# Patient Record
Sex: Female | Born: 1975
Health system: Southern US, Community
[De-identification: ages and names within clinical notes are randomized; demographics above are authoritative.]

## PROBLEM LIST (undated history)

## (undated) DIAGNOSIS — F329 Major depressive disorder, single episode, unspecified: Secondary | ICD-10-CM

## (undated) DIAGNOSIS — E7800 Pure hypercholesterolemia, unspecified: Secondary | ICD-10-CM

## (undated) DIAGNOSIS — F32A Depression, unspecified: Secondary | ICD-10-CM

## (undated) DIAGNOSIS — B958 Unspecified staphylococcus as the cause of diseases classified elsewhere: Secondary | ICD-10-CM

## (undated) HISTORY — PX: ABDOMINAL ADHESION SURGERY: SHX90

## (undated) HISTORY — PX: DILATION AND CURETTAGE OF UTERUS: SHX78

---

## 2004-02-25 ENCOUNTER — Other Ambulatory Visit: Admission: RE | Admit: 2004-02-25 | Discharge: 2004-02-25 | Payer: Self-pay | Admitting: Family Medicine

## 2005-03-04 ENCOUNTER — Other Ambulatory Visit: Admission: RE | Admit: 2005-03-04 | Discharge: 2005-03-04 | Payer: Self-pay | Admitting: Family Medicine

## 2006-03-11 ENCOUNTER — Other Ambulatory Visit: Admission: RE | Admit: 2006-03-11 | Discharge: 2006-03-11 | Payer: Self-pay | Admitting: Family Medicine

## 2006-12-24 ENCOUNTER — Inpatient Hospital Stay (HOSPITAL_COMMUNITY): Admission: AD | Admit: 2006-12-24 | Discharge: 2006-12-27 | Payer: Self-pay | Admitting: Obstetrics and Gynecology

## 2007-01-14 ENCOUNTER — Encounter: Admission: RE | Admit: 2007-01-14 | Discharge: 2007-02-13 | Payer: Self-pay | Admitting: Obstetrics and Gynecology

## 2007-02-14 ENCOUNTER — Encounter: Admission: RE | Admit: 2007-02-14 | Discharge: 2007-03-15 | Payer: Self-pay | Admitting: Obstetrics and Gynecology

## 2007-03-16 ENCOUNTER — Encounter: Admission: RE | Admit: 2007-03-16 | Discharge: 2007-04-15 | Payer: Self-pay | Admitting: Obstetrics and Gynecology

## 2007-04-07 ENCOUNTER — Other Ambulatory Visit: Admission: RE | Admit: 2007-04-07 | Discharge: 2007-04-07 | Payer: Self-pay | Admitting: Obstetrics and Gynecology

## 2007-04-16 ENCOUNTER — Encounter: Admission: RE | Admit: 2007-04-16 | Discharge: 2007-05-15 | Payer: Self-pay | Admitting: Obstetrics and Gynecology

## 2007-05-16 ENCOUNTER — Encounter: Admission: RE | Admit: 2007-05-16 | Discharge: 2007-06-15 | Payer: Self-pay | Admitting: Obstetrics and Gynecology

## 2007-06-16 ENCOUNTER — Encounter: Admission: RE | Admit: 2007-06-16 | Discharge: 2007-06-29 | Payer: Self-pay | Admitting: Obstetrics and Gynecology

## 2009-11-25 ENCOUNTER — Inpatient Hospital Stay (HOSPITAL_COMMUNITY): Admission: AD | Admit: 2009-11-25 | Discharge: 2009-11-25 | Payer: Self-pay | Admitting: Obstetrics and Gynecology

## 2009-12-04 ENCOUNTER — Ambulatory Visit (HOSPITAL_COMMUNITY): Admission: RE | Admit: 2009-12-04 | Discharge: 2009-12-04 | Payer: Self-pay | Admitting: Obstetrics and Gynecology

## 2009-12-10 ENCOUNTER — Ambulatory Visit (HOSPITAL_COMMUNITY): Admission: RE | Admit: 2009-12-10 | Discharge: 2009-12-10 | Payer: Self-pay | Admitting: Obstetrics and Gynecology

## 2009-12-31 DEATH — deceased

## 2010-11-20 ENCOUNTER — Ambulatory Visit (HOSPITAL_COMMUNITY)
Admission: RE | Admit: 2010-11-20 | Discharge: 2010-11-20 | Payer: Self-pay | Source: Home / Self Care | Attending: Obstetrics and Gynecology | Admitting: Obstetrics and Gynecology

## 2010-11-24 LAB — CBC
HCT: 39.5 % (ref 36.0–46.0)
Hemoglobin: 13.5 g/dL (ref 12.0–15.0)
MCH: 28.6 pg (ref 26.0–34.0)
MCHC: 34.2 g/dL (ref 30.0–36.0)
MCV: 83.7 fL (ref 78.0–100.0)
Platelets: 275 10*3/uL (ref 150–400)
RBC: 4.72 MIL/uL (ref 3.87–5.11)
RDW: 12.1 % (ref 11.5–15.5)
WBC: 7.3 10*3/uL (ref 4.0–10.5)

## 2010-12-03 DEATH — deceased

## 2010-12-10 NOTE — Op Note (Signed)
  NAME:  CLARITY, CISZEK NO.:  [id number]  MEDICAL RECORD NO.:  [id number]          PATIENT TYPE:  AMB  LOCATION:  SDC                           [facility]:  [facility]  PHYSICIAN:  Gerald Leitz, MD          DATE OF BIRTH:  01-24-1976  DATE OF PROCEDURE:  11/20/2010 DATE OF DISCHARGE:  11/20/2010                              OPERATIVE REPORT   PREOPERATIVE DIAGNOSIS:  Missed abortion at 8 weeks 5 days.  POSTOPERATIVE DIAGNOSIS:  Missed abortion at 8 weeks 5 days.  PROCEDURE:  Dilation and suction curettage.  SURGEON:  Dr. Gerald Leitz.  ASSISTANT:  None.  ANESTHESIA:  MAC.  FINDINGS:  8 cm uterus.  SPECIMEN:  Products of conception to Pathology.  ESTIMATED BLOOD LOSS:  Minimal.  COMPLICATIONS:  None.  PROCEDURE:  The patient was taken to the operating room where she was placed under MAC anesthesia.  She was placed in the dorsal lithotomy position, prepped and draped in a sterile fashion.  Speculum was placed into the vaginal vault.  Anterior lip of the cervix was grasped with single-tooth tenaculum.  10 mL of 0.25% Marcaine were injected at the 4 and 8 o'clock positions.  The uterus was then sounded to 8 cm.  It was then dilated to 8 mm.  The 8-mm suction curette was introduced, and the products of conception were removed under suction of 60 cm mercury following suction curettage.  Sharp curette was used and a gritty texture was noted.  The suction curette was then reintroduced and remaining products were removed.  Single-tooth tenaculum was removed. The patient had some bleeding from the left aspect of the tenaculum site.  Silver nitrate was applied and 0.2 mg of Methergine were given IM.  The patient was taken to the recovery room awake and in stable condition.    Gerald Leitz, MD    TC/MEDQ  D:  11/20/2010  T:  12/14/10  Job:  [id number]  Electronically Signed by Gerald Leitz MD on 12/10/2010 08:48:16 AM

## 2011-01-18 LAB — URINALYSIS, ROUTINE W REFLEX MICROSCOPIC
Bilirubin Urine: NEGATIVE
Glucose, UA: NEGATIVE mg/dL
Hgb urine dipstick: NEGATIVE
Ketones, ur: NEGATIVE mg/dL
Nitrite: NEGATIVE
Protein, ur: NEGATIVE mg/dL
Specific Gravity, Urine: <1.005 — ABNORMAL LOW (ref 1.005–1.030)
Urobilinogen, UA: 0.2 mg/dL (ref 0.0–1.0)
pH: 6 (ref 5.0–8.0)

## 2011-01-18 LAB — CBC
HCT: 40.1 % (ref 36.0–46.0)
Hemoglobin: 13.6 g/dL (ref 12.0–15.0)
MCHC: 33.8 g/dL (ref 30.0–36.0)
MCV: 88.4 fL (ref 78.0–100.0)
Platelets: 290 10*3/uL (ref 150–400)
RBC: 4.53 MIL/uL (ref 3.87–5.11)
RDW: 12.6 % (ref 11.5–15.5)
WBC: 6 10*3/uL (ref 4.0–10.5)

## 2011-01-18 LAB — POCT PREGNANCY, URINE: Preg Test, Ur: POSITIVE

## 2011-01-18 LAB — WET PREP, GENITAL
Clue Cells Wet Prep HPF POC: NONE SEEN
Trich, Wet Prep: NONE SEEN
Yeast Wet Prep HPF POC: NONE SEEN

## 2011-01-18 LAB — GC/CHLAMYDIA PROBE AMP, GENITAL
Chlamydia, DNA Probe: NEGATIVE
GC Probe Amp, Genital: NEGATIVE

## 2011-01-18 LAB — HCG, QUANTITATIVE, PREGNANCY: hCG, Beta Chain, Quant, S: 3352 m[IU]/mL — ABNORMAL HIGH (ref <5)

## 2011-01-18 LAB — ABO/RH: ABO/RH(D): A POS

## 2011-01-21 LAB — CBC
HCT: 41.5 % (ref 36.0–46.0)
Hemoglobin: 13.9 g/dL (ref 12.0–15.0)
MCHC: 33.5 g/dL (ref 30.0–36.0)
MCV: 88.5 fL (ref 78.0–100.0)
Platelets: 283 10*3/uL (ref 150–400)
RBC: 4.69 MIL/uL (ref 3.87–5.11)
RDW: 12.6 % (ref 11.5–15.5)
WBC: 6.6 10*3/uL (ref 4.0–10.5)

## 2011-01-27 ENCOUNTER — Other Ambulatory Visit: Payer: Self-pay | Admitting: Obstetrics and Gynecology

## 2011-01-27 ENCOUNTER — Other Ambulatory Visit (HOSPITAL_COMMUNITY)
Admission: RE | Admit: 2011-01-27 | Discharge: 2011-01-27 | Disposition: A | Discharge status: Home / Self Care | Payer: Managed Care, Other (non HMO) | Source: Ambulatory Visit | Attending: Obstetrics and Gynecology | Admitting: Obstetrics and Gynecology

## 2011-01-27 DIAGNOSIS — Z01419 Encounter for gynecological examination (general) (routine) without abnormal findings: Secondary | ICD-10-CM | POA: Insufficient documentation

## 2011-03-18 ENCOUNTER — Ambulatory Visit (HOSPITAL_COMMUNITY)
Admission: RE | Admit: 2011-03-18 | Discharge: 2011-03-18 | Disposition: A | Discharge status: Home / Self Care | Payer: Managed Care, Other (non HMO) | Source: Ambulatory Visit | Attending: Obstetrics and Gynecology | Admitting: Obstetrics and Gynecology

## 2011-03-18 ENCOUNTER — Other Ambulatory Visit: Payer: Self-pay | Admitting: Obstetrics and Gynecology

## 2011-03-18 ENCOUNTER — Ambulatory Visit (HOSPITAL_COMMUNITY): Payer: Managed Care, Other (non HMO)

## 2011-03-18 DIAGNOSIS — N971 Female infertility of tubal origin: Secondary | ICD-10-CM

## 2011-03-18 DIAGNOSIS — N856 Intrauterine synechiae: Secondary | ICD-10-CM | POA: Insufficient documentation

## 2011-03-18 LAB — CBC
HCT: 42.1 % (ref 36.0–46.0)
Hemoglobin: 14 g/dL (ref 12.0–15.0)
MCH: 28.7 pg (ref 26.0–34.0)
MCHC: 33.3 g/dL (ref 30.0–36.0)
MCV: 86.4 fL (ref 78.0–100.0)
Platelets: 258 10*3/uL (ref 150–400)
RBC: 4.87 MIL/uL (ref 3.87–5.11)
RDW: 12.3 % (ref 11.5–15.5)
WBC: 6.3 10*3/uL (ref 4.0–10.5)

## 2011-03-18 LAB — HCG, SERUM, QUALITATIVE: Preg, Serum: NEGATIVE

## 2011-03-20 NOTE — Discharge Summary (Signed)
NAME:  Margaret Kim, Margaret Kim NO.:  [id number]   MEDICAL RECORD NO.:  [id number]          PATIENT TYPE:  INP   LOCATION:  9142                          [facility]  [facility]   PHYSICIAN:  Gerald Leitz, MD          DATE OF BIRTH:  1976-05-09   DATE OF ADMISSION:  12/24/2006  DATE OF DISCHARGE:  12/27/2006                               DISCHARGE SUMMARY   ADMISSION DIAGNOSES:  Term intrauterine pregnancy.  1. Breech presentation.  2. Labor.   DISCHARGE DIAGNOSES:  Term intrauterine pregnancy.  1. Breech presentation.  2. Labor.   BRIEF HOSPITAL COURSE:  The patient was admitted on December 24, 2006,  in labor.  She was found to have a breech presentation and underwent  cesarean section.  She delivered a female infant with Apgars of 8 and 9  at one and five minutes respectively.  Infant's weight was 3440 g.  Length was 18-3/4 inches.  The patient did well postoperatively.  She  did develop anemia with a hemoglobin of 9.2 on December 25, 2006, which  is postop day #1.  She received routine postoperative care and was  started on iron sulfate, was discharged on December 27, 2006.   MEDICATIONS AT DISCHARGE:  Iron sulfate, Motrin and Percocet.   Follow up with Dr. Richardson Dopp in 102 days for staple removal and in 4-6 weeks  for postpartum visit.   CONDITION ON DISCHARGE:  Improved.   DIET:  Regular.   ACTIVITY:  Pelvic rest x6 weeks.  No driving for 2 weeks.  No heavy  lifting.      Gerald Leitz, MD  Electronically Signed     TC/MEDQ  D:  01/12/2007  T:  01/13/2007  Job:  [id number]

## 2011-03-20 NOTE — Op Note (Signed)
NAME:  Margaret Kim, Margaret Kim NO.:  [id number]   MEDICAL RECORD NO.:  [id number]          PATIENT TYPE:  INP   LOCATION:  9142                          [facility]   PHYSICIAN:  Gerald Leitz, MD          DATE OF BIRTH:  1975-12-28   DATE OF PROCEDURE:  12/24/2006  DATE OF DISCHARGE:                               OPERATIVE REPORT   PREOPERATIVE DIAGNOSES:  1. Term intrauterine pregnancy.  2. Breech presentation.  3. Thick meconium.   POSTOPERATIVE DIAGNOSES:  1. Term intrauterine pregnancy.  2. Breech presentation.  3. Thick meconium.   PROCEDURE:  Low transverse cesarean section   SURGEON:  Gerald Leitz, MD   ASSISTANT:  Charles A. Delcambre, MD.   Bronwen Betters BLOOD LOSS:  1200 mL.   FLUIDS:  Per anesthesia.   URINE OUTPUT:  Per anesthesia.   FINDINGS:  Female infant in the breech presentation, with Apgars of 8  and 9 at 1 and 5 minutes respectively.  Cord pH:  Arterial cord pH of  7.19.  Weight 7 pounds 9 ounces.  Ovaries and fallopian tubes appeared  normal bilaterally.   INDICATIONS:  This is a 35 year old G1 who presented to labor and  delivery completely dilated, breech presentation and thick meconium.  Underwent a primary cesarean section due to malpresentation.   DESCRIPTION OF PROCEDURE:  Informed consent was obtained.  The patient  was taken to the operating room, where a spinal anesthesia was placed.  It was found to be adequate.  She was then prepped and draped in the  usual sterile fashion.  A Pfannenstiel skin incision was made with the  scalpel and carried down to the underlying layer of fascia.  The fascia  was incised in the midline.  The incision was extended laterally with  Mayo scissors.  The inferior aspect of the fascial incision was grasped  with Kocher clamps, elevated the underlying rectus muscles which were  dissected off with Mayo scissors. This was repeated on the inferior  aspect of the fascial incision.  The rectus muscles were  separated in  the midline.  The peritoneum was identified and entered bluntly, and  Alexis retractor was placed.  Beneath the peritoneum the vesicouterine  peritoneum was then grasped with pickups and entered sharply with  Metzenbaum scissors.  The incision was extended laterally.  The bladder  flap was created digitally.  Bladder blade was inserted.  A low  transverse uterine incision was made with the scalpel.  The infant's  buttocks were removed.  Breech maneuvers were performed and the infant  was delivered.  Mouth and nose were suctioned with the DeLee.  The cord  was clamped x2 and the infant was handed off to the awaiting  neonatologist.  The placenta was removed manually.  The uterus was  exteriorized and cleared of all clot and debris.  The uterine incision  was repaired with 0 Vicryl in a running locked fashion.  A second layer  of the same suture was used for hemostasis.  The patient was noted to  have an extension of  the uterine incision along the right and lateral  aspect of the cervix.  This was repaired with 2-0 Vicryl in a running  locked fashion.  Excellent hemostasis was noted.  The uterus was  returned to the abdomen.  The abdomen was copiously irrigated and  cleared of all clot and debris.  The Alexis retractor was removed and  the fascia was reapproximated with 0 PDS.  The skin was closed with  staples.  Sponge, lap and needle counts were correct x2.  Ancef 2 grams  were given at cord clamp.   SPECIMEN:  Placenta.   DISPOSITION:  Specimen sent to labor and delivery.      Gerald Leitz, MD  Electronically Signed     TC/MEDQ  D:  12/24/2006  T:  12/24/2006  Job:  [id number]

## 2011-04-17 ENCOUNTER — Ambulatory Visit (HOSPITAL_COMMUNITY)
Admission: RE | Admit: 2011-04-17 | Discharge: 2011-04-17 | Disposition: A | Discharge status: Home / Self Care | Payer: Managed Care, Other (non HMO) | Source: Ambulatory Visit | Attending: Family Medicine | Admitting: Family Medicine

## 2011-04-17 DIAGNOSIS — M79609 Pain in unspecified limb: Secondary | ICD-10-CM

## 2011-06-05 NOTE — Op Note (Signed)
NAME:  Margaret Kim, WALKO               ACCOUNT NO.:  [id number]  MEDICAL RECORD NO.:  [id number]           PATIENT TYPE:  O  LOCATION:  [facility]                          [facility]  PHYSICIAN:  Fermin Schwab, MD   DATE OF BIRTH:  07-31-1976  DATE OF PROCEDURE:  03/18/2011 DATE OF DISCHARGE:                              OPERATIVE REPORT   PREOPERATIVE DIAGNOSIS:  Rule out uterine septum versus intrauterine adhesions.  POSTOPERATIVE DIAGNOSIS:  Intrauterine adhesions and retained old products of conception.  PROCEDURES:  Hysteroscopy, intraoperative HSG, lysis of adhesions, suction and curettage.  SURGEON:  Fermin Schwab, MD  ANESTHESIA:  General anesthesia.  COMPLICATIONS:  None.  BLOOD LOSS:  Minimal.  SPECIMENS:  Uterine curettings to Pathology.  FINDINGS:  On exam under anesthesia, vagina, external genitalia, Bartholin, Skene's, and urethra were normal.  External cervical os was distorted with healed postpartum laceration.  Endocervical canal was normal.  There was old products of conception obstructing the right half of the lower uterine segment and upper uterine fundus.  We could navigate around this and see the right tubal ostium as well as the left tubal ostium.  There were adhesions between anterior and posterior uterine wall that were holding this nonvital products of conception tissue in place.  In addition, there was fundal, marginal filling adhesion appearing as if it was a partial septum.  Intraoperative HSG confirmed above findings as well as confirming bilateral tubal patency.  DESCRIPTION OF PROCEDURE:  The patient was placed in lithotomy position. She was prepped and draped in sterile manner.  Exam under anesthesia showed the above findings.  Anterior cervical lip was carefully grasped with a tenaculum and dilute vasopressin solution 0.4 units/mL was injected into the cervical stroma.  A 5-French balloon catheter was used for an HSG.  Using  Hexabrix, an intraoperative HSG was performed and above findings were noted.  Next, a 1.5% glycine distention medium was used for distention during hysteroscopy, which was performed using a Slim-Line 30-degree hysteroscope.  Above findings were again confirmed. I first lysed the adhesion holding the products of conception which was stretching between the anterior and posterior walls of the uterus.  Then the fundal notching due to adhesion was cut with hysteroscopic scissors. Then the uterus sounded to 7 cm and a size 7 curved curette on a [place] machine was used to do suction curettage.  The above-mentioned nonviable tissue was aspirated and the hysteroscope was repeated and some more lysis of the lateral adhesions were carried out.  The fundal adhesiolysis was not carried out any further since fundus was noted to be attenuated.  Procedure was terminated and hemostasis was ensured. The instrument and sponge count were correct.  As an adhesion barrier, a Seprafilm sheath was rolled up and carefully inserted up to the fundus and left as an adhesion barrier.  It was trimmed off at the external cervical os.  The patient will take 2.5 mg b.i.d. high-dose conjugated equine estrogens for the next 30 days to which 10 mg of medroxyprogesterone acetate will be added for the last 5 days.  She will also take doxycycline 100 mg b.i.d.  for the next 7 days.     Fermin Schwab, MD     TY/MEDQ  D:  03/18/2011  T:  03/19/2011  Job:  [id number]  Electronically Signed by Fermin Schwab MD on 06/05/2011 04:12:28 PM

## 2011-08-11 LAB — GC/CHLAMYDIA PROBE AMP, GENITAL
Chlamydia: NEGATIVE
Gonorrhea: NEGATIVE

## 2011-08-15 ENCOUNTER — Encounter (HOSPITAL_COMMUNITY): Payer: Self-pay | Admitting: *Deleted

## 2011-08-15 ENCOUNTER — Inpatient Hospital Stay (HOSPITAL_COMMUNITY)
Admission: AD | Admit: 2011-08-15 | Discharge: 2011-08-15 | Disposition: A | Discharge status: Home / Self Care | Payer: Managed Care, Other (non HMO) | Source: Ambulatory Visit | Attending: Obstetrics and Gynecology | Admitting: Obstetrics and Gynecology

## 2011-08-15 ENCOUNTER — Inpatient Hospital Stay (HOSPITAL_COMMUNITY): Payer: Managed Care, Other (non HMO)

## 2011-08-15 DIAGNOSIS — O4690 Antepartum hemorrhage, unspecified, unspecified trimester: Secondary | ICD-10-CM

## 2011-08-15 DIAGNOSIS — O209 Hemorrhage in early pregnancy, unspecified: Secondary | ICD-10-CM | POA: Insufficient documentation

## 2011-08-15 NOTE — Progress Notes (Signed)
G4P1 SAB x2. Scant vag bleeding since 1730. Bright red. Very mild cramping.

## 2011-08-15 NOTE — ED Provider Notes (Signed)
History     Chief Complaint  Patient presents with  . Vaginal Bleeding   HPI  Patient is here with c/o continued vaginal bleeding. She only sees the bright red blood when she wipes. She was seen in her dr's office on Tuesday and Thursday. Both days they were unable to doppler fht but saw fht via ultrasound. Unable to doppler today. She c/o mild abdominal cramping.   History reviewed. No pertinent past medical history.  Past Surgical History  Procedure Date  . Abdominal adhesion surgery     removed in may 2012  . Cesarean section   . Dilation and curettage of uterus     sab x2 8weeks    No family history on file.  History  Substance Use Topics  . Smoking status: Never Smoker   . Smokeless tobacco: Not on file  . Alcohol Use: No    Allergies:  Allergies  Allergen Reactions  . Sulfa Antibiotics     As infant    Prescriptions prior to admission  Medication Sig Dispense Refill  . acetaminophen (TYLENOL) 325 MG tablet Take 650 mg by mouth every 6 (six) hours as needed. For pain       . Prenatal Vit-Fe Fumarate-FA (MULTIVITAMIN-PRENATAL) 27-0.8 MG TABS Take 1 tablet by mouth daily.          Review of Systems  Gastrointestinal: Positive for abdominal pain (cramping).  Genitourinary:       Vaginal bleeding   Physical Exam   Blood pressure 136/80, pulse 84, temperature 98.5 F (36.9 C), temperature source Oral, resp. rate 20, height 5\' 9"  (1.753 m), weight 97.342 kg (214 lb 9.6 oz), SpO2 98.00%.  Physical Exam  Constitutional: She is oriented to person, place, and time. She appears well-developed and well-nourished.  HENT:  Head: Normocephalic.  Neck: Normal range of motion. Neck supple.  Cardiovascular: Normal rate, regular rhythm and normal heart sounds.  Exam reveals no gallop and no friction rub.   No murmur heard. Respiratory: Effort normal and breath sounds normal. No respiratory distress.  GI: Soft. She exhibits no mass. There is no tenderness. There is no  rebound and no guarding.  Genitourinary: No bleeding around the vagina.  Neurological: She is alert and oriented to person, place, and time.  Skin: Skin is warm and dry.    MAU Course  Procedures  Ultrasound -  Intrauterine gestational sac: Visualized/normal in shape.  Yolk sac: Not visualized  Embryo: Present  Cardiac Activity: Present  Heart Rate: 149 bpm  CRL: 7.2 cm mm 13 w 3 d [place] EDC: 02/17/2012  Maternal uterus/Adnexae:  No subchorionic hemorrhage.  Placenta does not appear to be near the internal cervical os.  Ovaries were not identified on transabdominal imaging, question due  to obscuration by bowel gas.  No free pelvic fluid.  Consulted with Dr. Richardson Dopp > DC to home  Assessment and Plan  Bleeding during early pregnancy  DC to home F/U as scheduled  [facility] 08/15/2011, 9:48 PM

## 2011-08-15 NOTE — Progress Notes (Signed)
Patient is here with c/o continued vaginal bleeding. She only sees the bright red blood when she wipes. She was seen in her dr's office on Tuesday and Thursday. Both days they were unable to doppler fht but saw fht via ultrasound. Unable to doppler today. She c/o mild abdominal cramping that is not new but has right hip pain today.Marland Kitchen

## 2011-09-18 ENCOUNTER — Other Ambulatory Visit (HOSPITAL_COMMUNITY): Payer: Self-pay | Admitting: Obstetrics and Gynecology

## 2011-09-18 DIAGNOSIS — Z3689 Encounter for other specified antenatal screening: Secondary | ICD-10-CM

## 2011-09-18 DIAGNOSIS — R772 Abnormality of alphafetoprotein: Secondary | ICD-10-CM

## 2011-09-22 ENCOUNTER — Ambulatory Visit (HOSPITAL_COMMUNITY)
Admission: RE | Admit: 2011-09-22 | Discharge: 2011-09-22 | Disposition: A | Discharge status: Home / Self Care | Payer: Managed Care, Other (non HMO) | Source: Ambulatory Visit | Attending: Obstetrics and Gynecology | Admitting: Obstetrics and Gynecology

## 2011-09-22 ENCOUNTER — Encounter (HOSPITAL_COMMUNITY): Payer: Self-pay

## 2011-09-22 DIAGNOSIS — O09529 Supervision of elderly multigravida, unspecified trimester: Secondary | ICD-10-CM | POA: Insufficient documentation

## 2011-09-22 DIAGNOSIS — R772 Abnormality of alphafetoprotein: Secondary | ICD-10-CM

## 2011-09-22 DIAGNOSIS — Z1389 Encounter for screening for other disorder: Secondary | ICD-10-CM | POA: Insufficient documentation

## 2011-09-22 DIAGNOSIS — Z3689 Encounter for other specified antenatal screening: Secondary | ICD-10-CM

## 2011-09-22 DIAGNOSIS — Z363 Encounter for antenatal screening for malformations: Secondary | ICD-10-CM | POA: Insufficient documentation

## 2011-09-22 DIAGNOSIS — O34219 Maternal care for unspecified type scar from previous cesarean delivery: Secondary | ICD-10-CM | POA: Insufficient documentation

## 2011-09-22 DIAGNOSIS — O358XX0 Maternal care for other (suspected) fetal abnormality and damage, not applicable or unspecified: Secondary | ICD-10-CM | POA: Insufficient documentation

## 2011-09-22 NOTE — Progress Notes (Signed)
Genetic Counseling  High-Risk Gestation Note  Appointment Date:  09/22/2011 Referred By: Jessee Avers., MD Date of Birth:  Feb 10, 1976 Partner:  Margaret Kim    Pregnancy History: [id number] Estimated Date of Delivery: 02/20/12 Estimated Gestational Age: [redacted]w[redacted]d Attending: Particia Nearing, MD   [name] and her husband, Mr. Margaret Kim, were seen for genetic counseling regarding a maternal age of 35 y.o. and an increased risk for Down syndrome based on Quad screening performed through [facility].  They were counseled regarding maternal age and the association with risk for chromosome conditions due to nondisjunction with aging of the ova.   We reviewed chromosomes, nondisjunction, and the associated 1 in 141 risk for fetal aneuploidy related to a maternal age of 76 at [redacted] weeks gestation.  They were counseled that the risk for aneuploidy decreases as gestational age increases, accounting for those pregnancies which spontaneously abort.  We specifically discussed Down syndrome (trisomy 36), trisomies 37 and 49, and sex chromosome aneuploidies (47,XXX and 47,XXY) including the common features and prognoses of each.   We also reviewed [name]'s maternal serum Quad screen result and the associated increase in risk for fetal Down syndrome (1 in 250 to 1 in 123).  They were counseled regarding other explanations for a screen positive result including normal variation and differences in maternal metabolism.  In addition, we reviewed the screen negative risks for trisomy 18 (1 in 74) and ONTDs (1 in 62).  They understand that Quad screening provides a pregnancy specific risk for Down syndrome, but is not considered to be diagnostic.  They were counseled regarding other available screening and diagnostic options including ultrasound and amniocentesis.  The risks, benefits, and limitations of each of these options were reviewed in detail.  After thoughtful consideration of these options, they elected  to proceed with ultrasound, but declined amniocentesis at this time. The couple planned to further consider the option of amniocentesis and contact our office should she elect to pursue this testing in the pregnancy.  A complete detailed ultrasound was performed today.  The ultrasound report will be sent under separate cover.  They understand that screening tests cannot rule out all birth defects or genetic syndromes.  The patient was advised of this limitation and states she still does not want diagnostic testing at this time.  However, they were counseled that 50-80% of fetuses with Down syndrome and up to 90% of fetuses with trisomies 13 and 18, when well visualized, have detectable anomalies or soft markers by ultrasound.   [name] was provided with written information regarding cystic fibrosis (CF) including the carrier frequency and incidence in the Caucasian population, the availability of carrier testing and prenatal diagnosis if indicated.  In addition, we discussed that CF is routinely screened for as part of the [place] newborn screening panel.  She declined testing today.   Both family histories were reviewed and found to be contributory for learning disabilities for the patient's sister. She is currently 23 years old and lives with her parents. She is attending community college. An underlying cause is not known for her learning disabilities. We discussed that there are many different causes of mental retardation and learning disabilities such as genetic differences, sporadic causes, or injuries.  A specific diagnosis for mental retardation can be determined in approximately 50% of cases.  In the remaining 50% of cases, a diagnosis may not ever be determined.  Without more specific information, potential genetic risks cannot be determined. The patient  was advised to call if she is able to find out more information regarding a specific diagnosis.      Additionally, [name] reported a  female maternal first cousin born with a hole in her heart, which required several corrective surgeries. This individual also reportedly had severe developmental delay and was able to walk with assistance. She passed away at age 27 years. An underlying cause was not known for her features. We discussed that congenital heart defects and developmental delay can each occur separately due to genetics, environment, or a combination of genes and environment. It is also possible that these features were due to a single underlying cause. We discussed that without an identified cause, we would not be able to accurately assess recurrence risk for relatives. The patient also reported a female maternal first cousin once removed with a brain abnormality that was identified prenatally. He is currently 35 years old and is able to walk but reportedly does not talk much. We discussed that additional information is needed regarding the specific brain abnormality to assess recurrence risk and possible screening or testing options available. The patient may contact us should additional information be obtained regarding either or these relatives.   The patient also reported a paternal aunt with a history of a stillbirth and multiple miscarriages prior to having her son. She reportedly had a history of endometriosis and was described as being "older" when she was pregnant. There can be many different causes for pregnancy losses including clotting factors, autoimmune conditions, antibodies, structural differences in the uterus, maternal health conditions, or chromosome rearrangements.  An underlying cause may or may not have implications for other family members.  Without further information regarding the provided family history, an accurate genetic risk cannot be calculated. Further genetic counseling is warranted if more information is obtained.  [name] also reported a female paternal first cousin once removed with sickle cell  trait. We discussed sickle cell anemia (SCA) including: the features of SCA, autosomal recessive inheritance, and the availability of carrier testing through hemoglobin electrophoresis with quantitative A2.  We reviewed that sickle cell trait is less common in the Caucasian population compared to other ethnic groups. We also discussed that testing for SCA can be done prenatally if needed, and it is also routinely screened for on the newborn screening panel.  Given this reported family history, [name] has an approximate 1 in 48 risk to carry sickle cell trait. The risk for sickle cell disease in the pregnancy would depend upon the carrier status of both parents. [name] declined sickle cell screening through hemoglobin electrophoresis at this time.   [name] denied exposure to environmental toxins or chemical agents. She denied the use of alcohol, tobacco or street drugs. She denied significant viral illnesses during the course of her pregnancy. Her medical and surgical histories were noncontributory.     I counseled this couple regarding the above risks and available options.  The approximate face-to-face time with the genetic counselor was 45 minutes.    Margaret Braun Kordelia Severin, MS, [facility] 109-Oct-2012

## 2011-09-29 ENCOUNTER — Other Ambulatory Visit (HOSPITAL_COMMUNITY): Payer: Managed Care, Other (non HMO)

## 2011-11-03 NOTE — L&D Delivery Note (Signed)
Operative Delivery Note At 1:59 PM a viable female was delivered via VBAC, Vacuum Assisted.  Presentation: vertex; Position: Occiput,, Anterior; Station: +3.  Verbal consent: obtained from patient.  Risks and benefits discussed in detail.  Risks include, but are not limited to the risks of anesthesia, bleeding, infection, damage to maternal tissues, fetal cephalhematoma.  There is also the risk of inability to effect vaginal delivery of the head, or shoulder dystocia that cannot be resolved by established maneuvers, leading to the need for emergency cesarean section.  APGAR: 8, 9; weight .   Placenta status: Intact, Spontaneous.   Cord: 3 vessels with the following complications: None.  Cord pH: NA  Anesthesia: Local lidocaine Instruments: Vacuum  Episiotomy: None Lacerations: 2nd degree;Labial;Periurethral Suture Repair: 3.0 vicryl Est. Blood Loss (mL): 400  Mom to postpartum.  Baby to nursery-stable.  Arjun Hard J. 02/12/2012, 3:05 PM

## 2011-12-01 LAB — ABO/RH: RH Type: POSITIVE

## 2011-12-01 LAB — RUBELLA ANTIBODY, IGM: Rubella: IMMUNE

## 2011-12-01 LAB — RPR: RPR: NONREACTIVE

## 2011-12-01 LAB — HIV ANTIBODY (ROUTINE TESTING W REFLEX): HIV: NONREACTIVE

## 2011-12-01 LAB — HEPATITIS B SURFACE ANTIGEN: Hepatitis B Surface Ag: NEGATIVE

## 2011-12-01 LAB — ANTIBODY SCREEN: Antibody Screen: NEGATIVE

## 2012-01-22 ENCOUNTER — Encounter (HOSPITAL_COMMUNITY): Payer: Self-pay | Admitting: Pharmacist

## 2012-01-23 IMAGING — US US OB COMP LESS 14 WK
1 series · 14 of 28 positions shown · non-contrast
Comparison: none

OBSTETRICAL ULTRASOUND:
 This ultrasound exam was performed in the [HOSPITAL] Ultrasound Department.  The OB US report was generated in the AS system, and faxed to the ordering physician.  This report is also available in [HOSPITAL]?s [employer] and in [REDACTED] PACS.

[Series 1: us pelvis complete modify · 0.26mm/px · 14 of 50 slices shown]
[im 2/50]
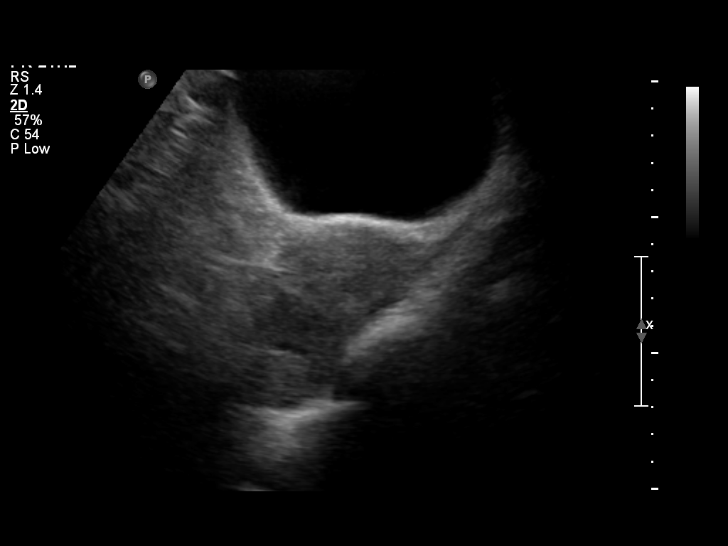
[im 6/50]
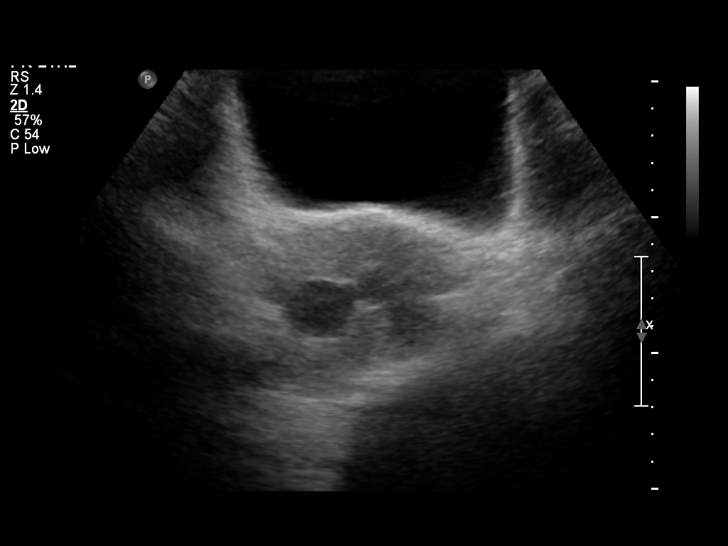
[im 10/50]
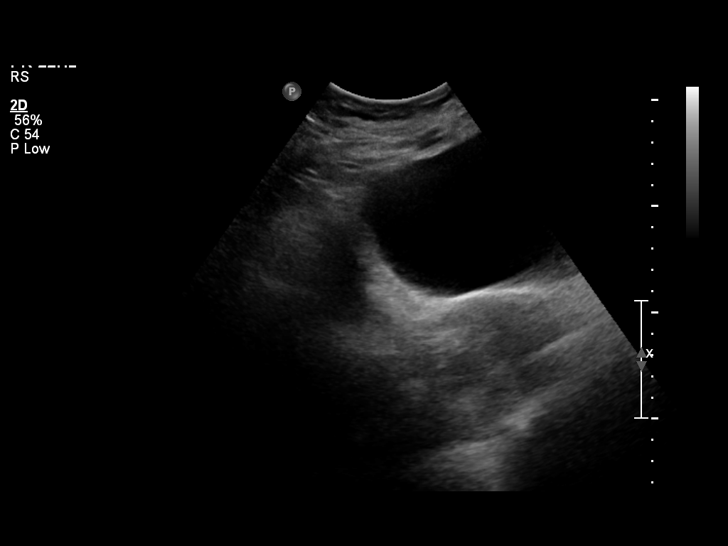
[im 13/50]
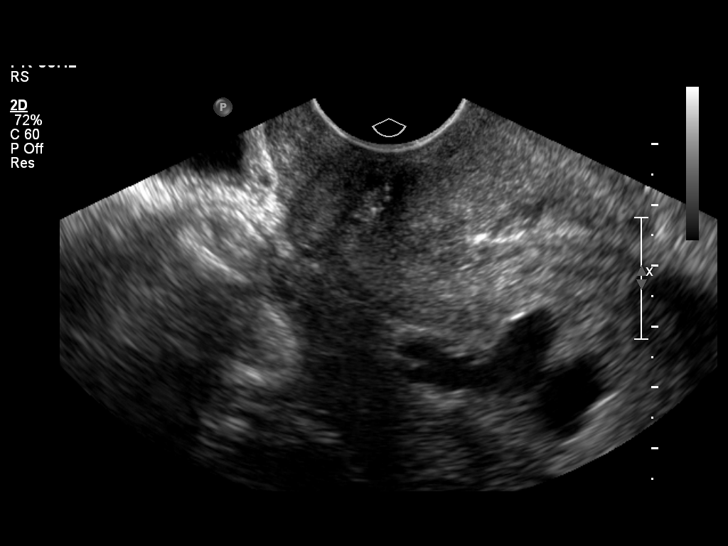
[im 17/50]
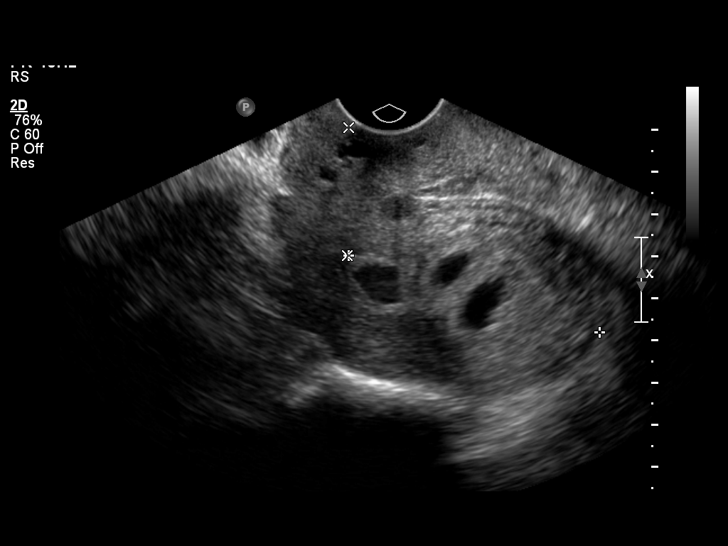
[im 20/50]
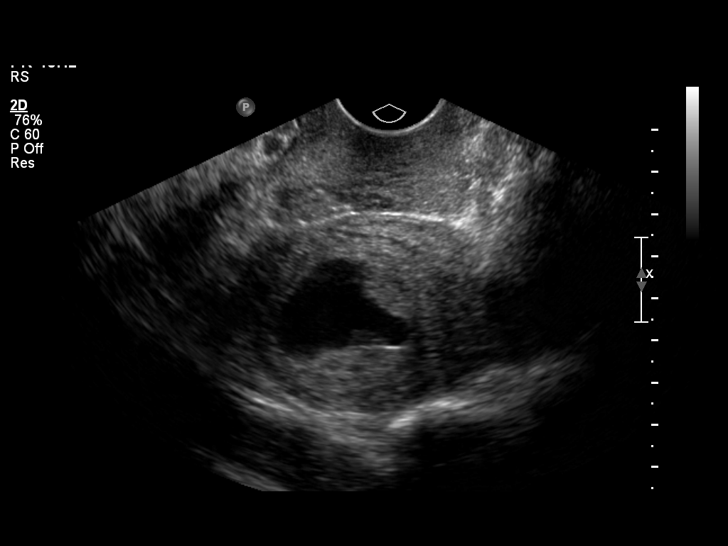
[im 24/50]
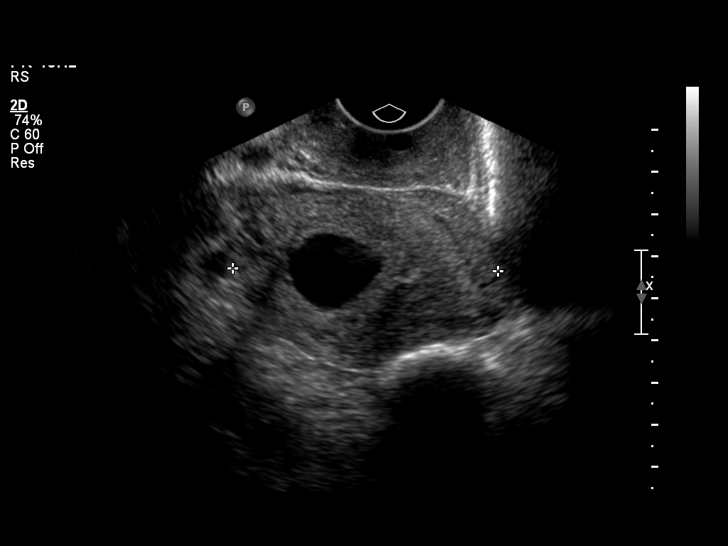
[im 28/50]
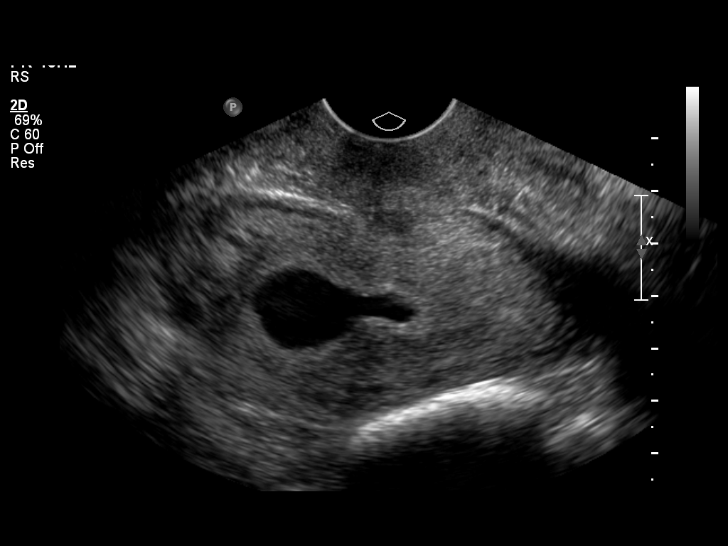
[im 31/50]
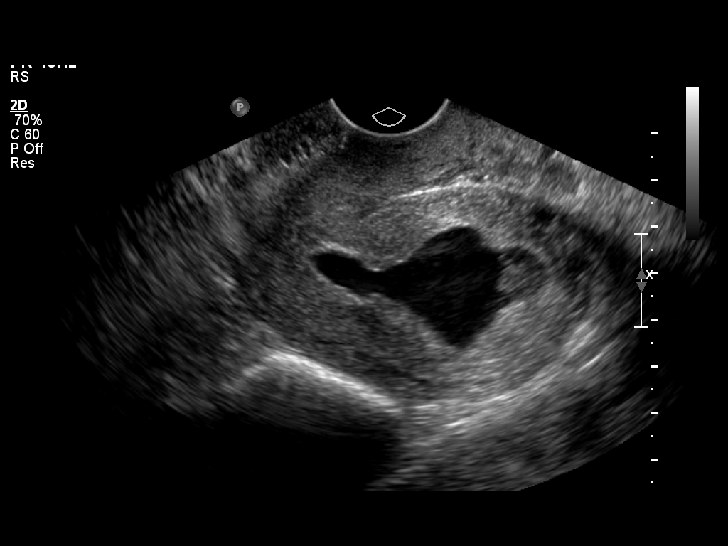
[im 35/50]
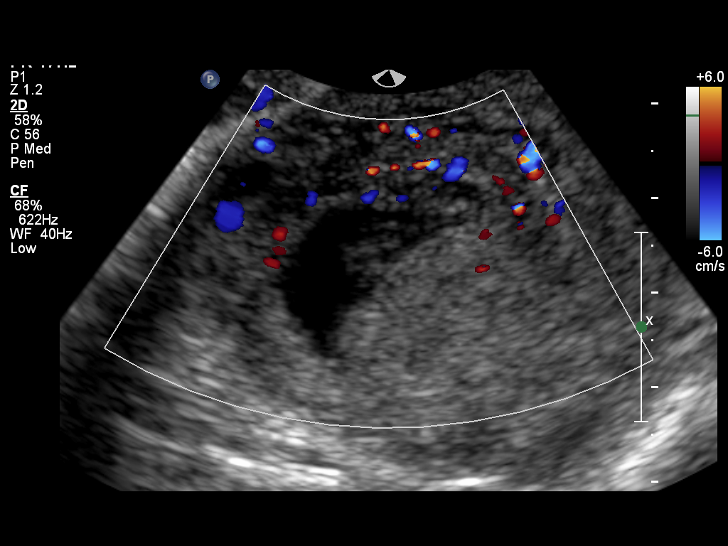
[im 39/50]
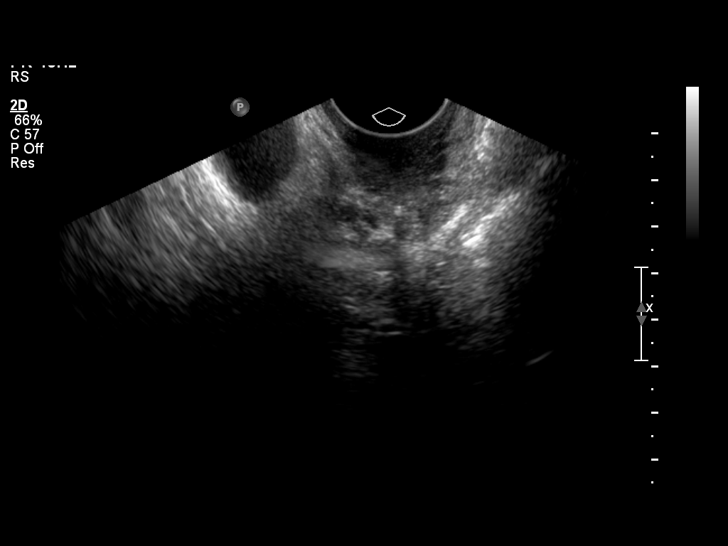
[im 42/50]
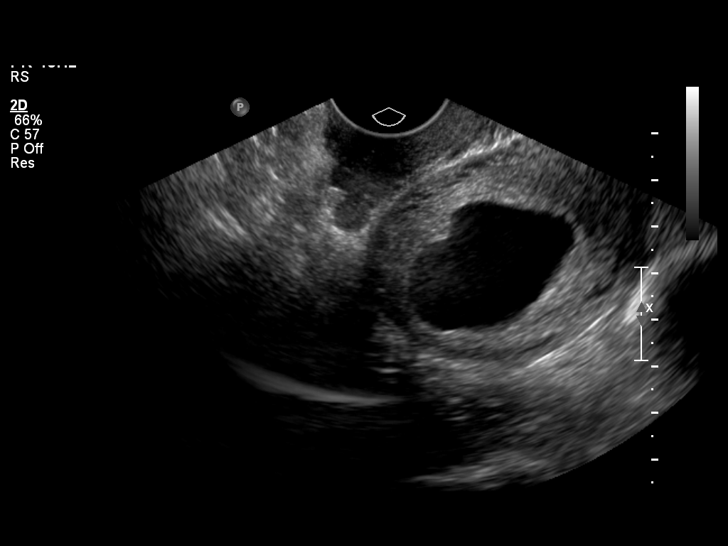
[im 46/50]
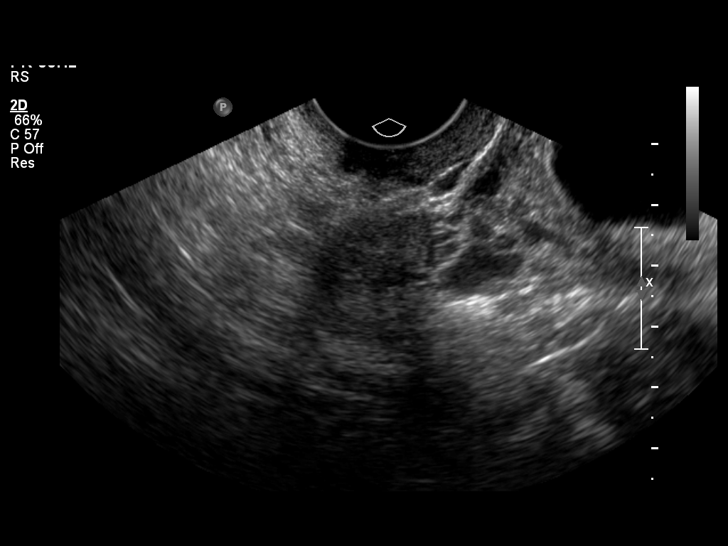
[im 50/50]
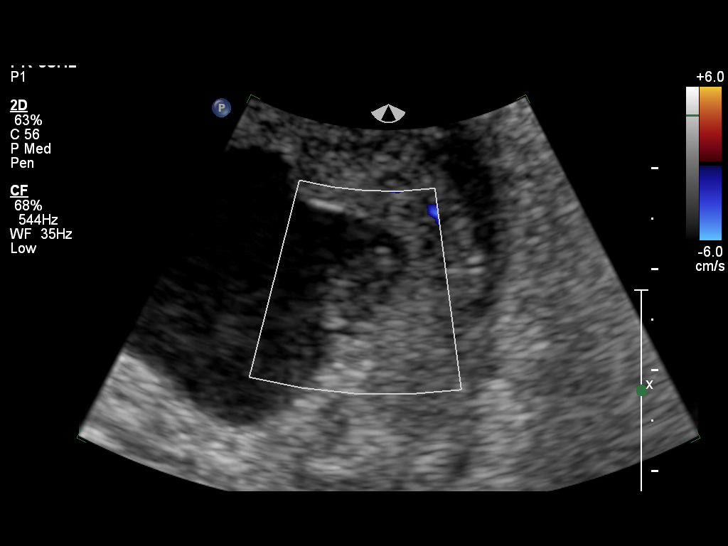

[14 of 28 positions shown; findings below may reference images not displayed]

IMPRESSION: See AS Obstetric US report.

## 2012-01-25 LAB — STREP B DNA PROBE: GBS: NEGATIVE

## 2012-02-09 ENCOUNTER — Encounter (HOSPITAL_COMMUNITY): Payer: Self-pay

## 2012-02-09 ENCOUNTER — Encounter (HOSPITAL_COMMUNITY)
Admission: RE | Admit: 2012-02-09 | Discharge: 2012-02-09 | Disposition: A | Discharge status: Home / Self Care | Payer: Managed Care, Other (non HMO) | Source: Ambulatory Visit | Attending: Obstetrics and Gynecology | Admitting: Obstetrics and Gynecology

## 2012-02-09 LAB — CBC
HCT: 39.3 % (ref 36.0–46.0)
Hemoglobin: 13.1 g/dL (ref 12.0–15.0)
MCH: 29.6 pg (ref 26.0–34.0)
MCHC: 33.3 g/dL (ref 30.0–36.0)
MCV: 88.7 fL (ref 78.0–100.0)
Platelets: 217 10*3/uL (ref 150–400)
RBC: 4.43 MIL/uL (ref 3.87–5.11)
RDW: 13.7 % (ref 11.5–15.5)
WBC: 9.5 10*3/uL (ref 4.0–10.5)

## 2012-02-09 LAB — SURGICAL PCR SCREEN
MRSA, PCR: NEGATIVE
Staphylococcus aureus: POSITIVE — AB

## 2012-02-09 LAB — RPR: RPR Ser Ql: NONREACTIVE

## 2012-02-09 NOTE — Patient Instructions (Addendum)
[name]  02/09/2012   Your procedure is scheduled on:  02/17/12  Enter through the Main Entrance of [facility] at 1 PM  Pick up the phone at the desk and dial [phone].   Call this number if you have problems the morning of surgery: [phone]   Remember:   Do not eat food:After Midnight.  Do not drink clear liquids: 4 Hours before arrival.  Take these medicines the morning of surgery with A SIP OF WATER: NA   Do not wear jewelry, make-up or nail polish.  Do not wear lotions, powders, or perfumes. You may wear deodorant.  Do not shave 48 hours prior to surgery.  Do not bring valuables to the hospital.  Contacts, dentures or bridgework may not be worn into surgery.  Leave suitcase in the car. After surgery it may be brought to your room.  For patients admitted to the hospital, checkout time is 11:00 AM the day of discharge.   Patients discharged the day of surgery will not be allowed to drive home.  Name and phone number of your driver: NA  Special Instructions: CHG Shower Use Special Wash: 1/2 bottle night before surgery and 1/2 bottle morning of surgery.   Please read over the following fact sheets that you were given: MRSA Information

## 2012-02-11 DIAGNOSIS — B958 Unspecified staphylococcus as the cause of diseases classified elsewhere: Secondary | ICD-10-CM

## 2012-02-11 HISTORY — DX: Unspecified staphylococcus as the cause of diseases classified elsewhere: B95.8

## 2012-02-12 ENCOUNTER — Encounter (HOSPITAL_COMMUNITY): Payer: Self-pay | Admitting: *Deleted

## 2012-02-12 ENCOUNTER — Encounter (HOSPITAL_COMMUNITY): Payer: Self-pay | Admitting: Anesthesiology

## 2012-02-12 ENCOUNTER — Encounter (HOSPITAL_COMMUNITY)
Admission: AD | Disposition: A | Discharge status: Home / Self Care | Payer: Self-pay | Source: Ambulatory Visit | Attending: Obstetrics and Gynecology

## 2012-02-12 ENCOUNTER — Inpatient Hospital Stay (HOSPITAL_COMMUNITY): Payer: Managed Care, Other (non HMO) | Admitting: Anesthesiology

## 2012-02-12 ENCOUNTER — Inpatient Hospital Stay (HOSPITAL_COMMUNITY)
Admission: AD | Admit: 2012-02-12 | Discharge: 2012-02-14 | DRG: 775 | Disposition: A | Discharge status: Home / Self Care | Payer: Managed Care, Other (non HMO) | Source: Ambulatory Visit | Attending: Obstetrics and Gynecology | Admitting: Obstetrics and Gynecology

## 2012-02-12 DIAGNOSIS — O9903 Anemia complicating the puerperium: Secondary | ICD-10-CM | POA: Diagnosis not present

## 2012-02-12 DIAGNOSIS — D649 Anemia, unspecified: Secondary | ICD-10-CM | POA: Diagnosis not present

## 2012-02-12 DIAGNOSIS — Z01812 Encounter for preprocedural laboratory examination: Secondary | ICD-10-CM

## 2012-02-12 DIAGNOSIS — Z01818 Encounter for other preprocedural examination: Secondary | ICD-10-CM

## 2012-02-12 DIAGNOSIS — O09529 Supervision of elderly multigravida, unspecified trimester: Secondary | ICD-10-CM | POA: Diagnosis present

## 2012-02-12 DIAGNOSIS — O34219 Maternal care for unspecified type scar from previous cesarean delivery: Secondary | ICD-10-CM | POA: Diagnosis present

## 2012-02-12 HISTORY — DX: Unspecified staphylococcus as the cause of diseases classified elsewhere: B95.8

## 2012-02-12 LAB — CBC
HCT: 39.1 % (ref 36.0–46.0)
Hemoglobin: 13 g/dL (ref 12.0–15.0)
MCH: 29.7 pg (ref 26.0–34.0)
MCHC: 33.2 g/dL (ref 30.0–36.0)
MCV: 89.3 fL (ref 78.0–100.0)
Platelets: 202 10*3/uL (ref 150–400)
RBC: 4.38 MIL/uL (ref 3.87–5.11)
RDW: 13.7 % (ref 11.5–15.5)
WBC: 8.9 10*3/uL (ref 4.0–10.5)

## 2012-02-12 LAB — RPR: RPR Ser Ql: NONREACTIVE

## 2012-02-12 LAB — TYPE AND SCREEN
ABO/RH(D): A POS
Antibody Screen: NEGATIVE

## 2012-02-12 SURGERY — Surgical Case
Anesthesia: Spinal

## 2012-02-12 MED ORDER — METHYLERGONOVINE MALEATE 0.2 MG PO TABS: 0.2000 mg | ORAL_TABLET | ORAL | Status: DC | PRN

## 2012-02-12 MED ORDER — OXYCODONE-ACETAMINOPHEN 5-325 MG PO TABS: 1.0000 | ORAL_TABLET | ORAL | Status: DC | PRN

## 2012-02-12 MED ORDER — OXYTOCIN 20 UNITS IN LACTATED RINGERS INFUSION - SIMPLE: 125.0000 mL/h | Freq: Once | INTRAVENOUS | Status: DC

## 2012-02-12 MED ORDER — ONDANSETRON HCL 4 MG/2ML IJ SOLN: 4.0000 mg | Freq: Three times a day (TID) | INTRAMUSCULAR | Status: DC | PRN

## 2012-02-12 MED ORDER — MEPERIDINE HCL 25 MG/ML IJ SOLN: 6.2500 mg | INTRAMUSCULAR | Status: DC | PRN

## 2012-02-12 MED ORDER — DIPHENHYDRAMINE HCL 50 MG/ML IJ SOLN: 12.5000 mg | INTRAMUSCULAR | Status: DC | PRN

## 2012-02-12 MED ORDER — WITCH HAZEL-GLYCERIN EX PADS
1.0000 "application " | MEDICATED_PAD | CUTANEOUS | Status: DC | PRN
  Administered 2012-02-12: 1 via TOPICAL

## 2012-02-12 MED ORDER — OXYTOCIN 10 UNIT/ML IJ SOLN
INTRAMUSCULAR | Status: AC
  Administered 2012-02-12: 10 [IU]
  Filled 2012-02-12: qty 2

## 2012-02-12 MED ORDER — BENZOCAINE-MENTHOL 20-0.5 % EX AERO
INHALATION_SPRAY | CUTANEOUS | Status: AC
  Administered 2012-02-12: 18:00:00
  Filled 2012-02-12: qty 56

## 2012-02-12 MED ORDER — NALOXONE HCL 0.4 MG/ML IJ SOLN: 0.4000 mg | INTRAMUSCULAR | Status: DC | PRN

## 2012-02-12 MED ORDER — KETOROLAC TROMETHAMINE 30 MG/ML IJ SOLN
30.0000 mg | Freq: Four times a day (QID) | INTRAMUSCULAR | Status: DC | PRN
  Filled 2012-02-12: qty 1

## 2012-02-12 MED ORDER — SCOPOLAMINE 1 MG/3DAYS TD PT72
1.0000 | MEDICATED_PATCH | Freq: Once | TRANSDERMAL | Status: DC
  Filled 2012-02-12: qty 1

## 2012-02-12 MED ORDER — NALBUPHINE HCL 10 MG/ML IJ SOLN: 5.0000 mg | INTRAMUSCULAR | Status: DC | PRN

## 2012-02-12 MED ORDER — SIMETHICONE 80 MG PO CHEW: 80.0000 mg | CHEWABLE_TABLET | ORAL | Status: DC | PRN

## 2012-02-12 MED ORDER — IBUPROFEN 600 MG PO TABS
600.0000 mg | ORAL_TABLET | Freq: Four times a day (QID) | ORAL | Status: DC
  Administered 2012-02-12 – 2012-02-14 (×8): 600 mg via ORAL
  Filled 2012-02-12 (×8): qty 1

## 2012-02-12 MED ORDER — CITRIC ACID-SODIUM CITRATE 334-500 MG/5ML PO SOLN: 30.0000 mL | Freq: Once | ORAL | Status: DC

## 2012-02-12 MED ORDER — FAMOTIDINE IN NACL 20-0.9 MG/50ML-% IV SOLN: 20.0000 mg | Freq: Once | INTRAVENOUS | Status: DC

## 2012-02-12 MED ORDER — DIPHENHYDRAMINE HCL 25 MG PO CAPS: 25.0000 mg | ORAL_CAPSULE | ORAL | Status: DC | PRN

## 2012-02-12 MED ORDER — FLEET ENEMA 7-19 GM/118ML RE ENEM: 1.0000 | ENEMA | RECTAL | Status: DC | PRN

## 2012-02-12 MED ORDER — SODIUM CHLORIDE 0.9 % IV SOLN
1.0000 ug/kg/h | INTRAVENOUS | Status: DC | PRN
  Filled 2012-02-12: qty 2.5

## 2012-02-12 MED ORDER — DIBUCAINE 1 % RE OINT: 1.0000 "application " | TOPICAL_OINTMENT | RECTAL | Status: DC | PRN

## 2012-02-12 MED ORDER — CITRIC ACID-SODIUM CITRATE 334-500 MG/5ML PO SOLN: 30.0000 mL | ORAL | Status: DC | PRN

## 2012-02-12 MED ORDER — OXYTOCIN BOLUS FROM INFUSION
500.0000 mL | Freq: Once | INTRAVENOUS | Status: DC
  Filled 2012-02-12: qty 500

## 2012-02-12 MED ORDER — LIDOCAINE HCL (PF) 1 % IJ SOLN
30.0000 mL | INTRAMUSCULAR | Status: DC | PRN
  Administered 2012-02-12: 30 mL via SUBCUTANEOUS

## 2012-02-12 MED ORDER — METHYLERGONOVINE MALEATE 0.2 MG/ML IJ SOLN: 0.2000 mg | INTRAMUSCULAR | Status: DC | PRN

## 2012-02-12 MED ORDER — TETANUS-DIPHTH-ACELL PERTUSSIS 5-2.5-18.5 LF-MCG/0.5 IM SUSP: 0.5000 mL | Freq: Once | INTRAMUSCULAR | Status: DC

## 2012-02-12 MED ORDER — DIPHENHYDRAMINE HCL 50 MG/ML IJ SOLN: 25.0000 mg | INTRAMUSCULAR | Status: DC | PRN

## 2012-02-12 MED ORDER — ONDANSETRON HCL 4 MG/2ML IJ SOLN: 4.0000 mg | Freq: Four times a day (QID) | INTRAMUSCULAR | Status: DC | PRN

## 2012-02-12 MED ORDER — ONDANSETRON HCL 4 MG PO TABS: 4.0000 mg | ORAL_TABLET | ORAL | Status: DC | PRN

## 2012-02-12 MED ORDER — DEXTROSE 5 % IV SOLN
2.0000 g | INTRAVENOUS | Status: AC
  Administered 2012-02-12: 2 g via INTRAVENOUS
  Filled 2012-02-12: qty 2

## 2012-02-12 MED ORDER — BUTORPHANOL TARTRATE 2 MG/ML IJ SOLN: 1.0000 mg | INTRAMUSCULAR | Status: DC | PRN

## 2012-02-12 MED ORDER — KETOROLAC TROMETHAMINE 60 MG/2ML IM SOLN
60.0000 mg | Freq: Once | INTRAMUSCULAR | Status: DC | PRN
  Filled 2012-02-12: qty 2

## 2012-02-12 MED ORDER — FENTANYL CITRATE 0.05 MG/ML IJ SOLN: 25.0000 ug | INTRAMUSCULAR | Status: DC | PRN

## 2012-02-12 MED ORDER — IBUPROFEN 600 MG PO TABS: 600.0000 mg | ORAL_TABLET | Freq: Four times a day (QID) | ORAL | Status: DC | PRN

## 2012-02-12 MED ORDER — METOCLOPRAMIDE HCL 5 MG/ML IJ SOLN: 10.0000 mg | Freq: Three times a day (TID) | INTRAMUSCULAR | Status: DC | PRN

## 2012-02-12 MED ORDER — LACTATED RINGERS IV SOLN: INTRAVENOUS | Status: DC

## 2012-02-12 MED ORDER — DIPHENHYDRAMINE HCL 25 MG PO CAPS: 25.0000 mg | ORAL_CAPSULE | Freq: Four times a day (QID) | ORAL | Status: DC | PRN

## 2012-02-12 MED ORDER — ONDANSETRON HCL 4 MG/2ML IJ SOLN: 4.0000 mg | INTRAMUSCULAR | Status: DC | PRN

## 2012-02-12 MED ORDER — OXYTOCIN 10 UNIT/ML IJ SOLN: 10.0000 [IU] | Freq: Once | INTRAMUSCULAR | Status: DC

## 2012-02-12 MED ORDER — SODIUM CHLORIDE 0.9 % IJ SOLN: 3.0000 mL | INTRAMUSCULAR | Status: DC | PRN

## 2012-02-12 MED ORDER — SENNOSIDES-DOCUSATE SODIUM 8.6-50 MG PO TABS
2.0000 | ORAL_TABLET | Freq: Every day | ORAL | Status: DC
  Administered 2012-02-12 – 2012-02-13 (×2): 2 via ORAL

## 2012-02-12 MED ORDER — ZOLPIDEM TARTRATE 5 MG PO TABS: 5.0000 mg | ORAL_TABLET | Freq: Every evening | ORAL | Status: DC | PRN

## 2012-02-12 MED ORDER — BENZOCAINE-MENTHOL 20-0.5 % EX AERO: 1.0000 "application " | INHALATION_SPRAY | CUTANEOUS | Status: DC | PRN

## 2012-02-12 MED ORDER — LANOLIN HYDROUS EX OINT: TOPICAL_OINTMENT | CUTANEOUS | Status: DC | PRN

## 2012-02-12 MED ORDER — ACETAMINOPHEN 325 MG PO TABS: 650.0000 mg | ORAL_TABLET | ORAL | Status: DC | PRN

## 2012-02-12 MED ORDER — PRENATAL MULTIVITAMIN CH: 1.0000 | ORAL_TABLET | Freq: Every day | ORAL | Status: DC

## 2012-02-12 MED ORDER — LACTATED RINGERS IV SOLN: 500.0000 mL | INTRAVENOUS | Status: DC | PRN

## 2012-02-12 SURGICAL SUPPLY — 36 items
APL SKNCLS STERI-STRIP NONHPOA (GAUZE/BANDAGES/DRESSINGS)
BENZOIN TINCTURE PRP APPL 2/3 (GAUZE/BANDAGES/DRESSINGS) IMPLANT
CLOTH BEACON ORANGE TIMEOUT ST (SAFETY) IMPLANT
CONTAINER PREFILL 10% NBF 15ML (MISCELLANEOUS) IMPLANT
DRESSING TELFA 8X3 (GAUZE/BANDAGES/DRESSINGS) IMPLANT
ELECT REM PT RETURN 9FT ADLT (ELECTROSURGICAL)
ELECTRODE REM PT RTRN 9FT ADLT (ELECTROSURGICAL) ×1 IMPLANT
EXTRACTOR VACUUM M CUP 4 TUBE (SUCTIONS) IMPLANT
GAUZE SPONGE 4X4 12PLY STRL LF (GAUZE/BANDAGES/DRESSINGS) IMPLANT
GLOVE BIOGEL M 6.5 STRL (GLOVE) IMPLANT
GLOVE BIOGEL PI IND STRL 6.5 (GLOVE) IMPLANT
GLOVE BIOGEL PI INDICATOR 6.5 (GLOVE)
GOWN PREVENTION PLUS LG XLONG (DISPOSABLE) IMPLANT
GOWN PREVENTION PLUS XLARGE (GOWN DISPOSABLE) IMPLANT
KIT ABG SYR 3ML LUER SLIP (SYRINGE) IMPLANT
NEEDLE HYPO 25X5/8 SAFETYGLIDE (NEEDLE) IMPLANT
NS IRRIG 1000ML POUR BTL (IV SOLUTION) IMPLANT
PACK C SECTION WH (CUSTOM PROCEDURE TRAY) IMPLANT
PAD ABD 7.5X8 STRL (GAUZE/BANDAGES/DRESSINGS) IMPLANT
RTRCTR C-SECT PINK 25CM LRG (MISCELLANEOUS) IMPLANT
RTRCTR C-SECT PINK 34CM XLRG (MISCELLANEOUS) IMPLANT
SLEEVE SCD COMPRESS KNEE MED (MISCELLANEOUS) IMPLANT
STAPLER VISISTAT 35W (STAPLE) IMPLANT
STRIP CLOSURE SKIN 1/2X4 (GAUZE/BANDAGES/DRESSINGS) IMPLANT
SUT PDS AB 0 CT1 27 (SUTURE) IMPLANT
SUT PLAIN 0 NONE (SUTURE) IMPLANT
SUT VIC AB 0 CTX 36 (SUTURE)
SUT VIC AB 0 CTX36XBRD ANBCTRL (SUTURE) IMPLANT
SUT VIC AB 2-0 CT1 27 (SUTURE)
SUT VIC AB 2-0 CT1 TAPERPNT 27 (SUTURE) IMPLANT
SUT VIC AB 3-0 SH 27 (SUTURE)
SUT VIC AB 3-0 SH 27X BRD (SUTURE) IMPLANT
SUT VIC AB 4-0 KS 27 (SUTURE) IMPLANT
TOWEL OR 17X24 6PK STRL BLUE (TOWEL DISPOSABLE) IMPLANT
TRAY FOLEY CATH 14FR (SET/KITS/TRAYS/PACK) IMPLANT
WATER STERILE IRR 1000ML POUR (IV SOLUTION) IMPLANT

## 2012-02-12 NOTE — MAU Provider Note (Signed)
Pt reports gush of fluid at 7 AM. No pain or contractions. Spec exam requested by RN for r/o SROM only. Pt has planned repeat c/s scheduled next week.   O: Spec: + pooling clear fluid Fern positive  EFM reactive  A: 35 y.o. [id number] at [redacted]w[redacted]d SROM  P: RN to report to Dr. Richardson Dopp for plan

## 2012-02-12 NOTE — MAU Note (Signed)
Pt felt gush of fluid from vagina at 0700 this morning.  Clear Fluid without vaginal bleeding.  Pt denies and U/C's at this time.

## 2012-02-12 NOTE — H&P (Signed)
Margaret Kim is a 36 y.o. female presenting for c/o of SROM this am clear fluid. Pt was scheduled for repeat cesarean section on 02/17/2012. She desires to attempt VBAC instead. + FM . Reports ctx that started at 12 noon. Currently feeling pressure  OB History    Grav Para Term Preterm Abortions TAB SAB Ect Mult Living   4 2 2  2  2   2      Past Medical History  Diagnosis Date  . Staph infection 02-11-12    started meds after positve nasal swab   Past Surgical History  Procedure Date  . Abdominal adhesion surgery     removed in may 2012  . Cesarean section   . Dilation and curettage of uterus     sab x2 8weeks   Family History: family history includes Congenital heart disease in her cousin; Learning disabilities in her sister; Mental retardation in her cousin; Miscarriages / Stillbirths in her paternal aunt; Other in her cousin; and Sickle cell trait in her cousin. Social History:  reports that she has never smoked. She has never used smokeless tobacco. She reports that she does not drink alcohol or use illicit drugs.  ROS negative except as stated in HPI   Dilation: 10 Effacement (%): 100 Station: +2 Exam by:: Montez Morita, RNC Blood pressure 131/73, pulse 75, temperature 98.1 F (36.7 C), temperature source Oral, resp. rate 18, height 5\' 9"  (1.753 m), weight 103.148 kg (227 lb 6.4 oz), SpO2 93.00%, unknown if currently breastfeeding. CV rrr Lung clear abd gravid  Ext no edema cx complete + 2 station  Prenatal labs: ABO, Rh: --/--/A POS (04/12 0930) Antibody: NEG (04/12 0930) Rubella:   RPR: NON REACTIVE (04/09 1040)  HBsAg: Negative (01/29 1347)  HIV: Non-reactive (01/29 1347)  GBS: Negative (03/25 1431)   Assessment/Plan: 39 wks with h/o cesarean section desires VBAC. R/o vbac was discussed with the patient including 1% r/o rupture with 50 % of maternal or fetal mortality if rupture occurs. She voiced understanding and desires to proceed with VBAC.  GBS  negative Anticipate svd    Marlane Hirschmann J. 02/12/2012, 3:00 PM

## 2012-02-12 NOTE — Anesthesia Preprocedure Evaluation (Deleted)
Anesthesia Evaluation  Patient identified by MRN, date of birth, ID band Patient awake    Reviewed: Allergy & Precautions, H&P , NPO status , Patient's Chart, lab work & pertinent test results, reviewed documented beta blocker date and time   History of Anesthesia Complications Negative for: history of anesthetic complications  Airway Mallampati: III TM Distance: >3 FB Neck ROM: full    Dental  (+) Teeth Intact   Pulmonary neg pulmonary ROS,  breath sounds clear to auscultation        Cardiovascular negative cardio ROS  Rhythm:regular Rate:Normal     Neuro/Psych negative neurological ROS  negative psych ROS   GI/Hepatic negative GI ROS, Neg liver ROS,   Endo/Other  negative endocrine ROS  Renal/GU negative Renal ROS  negative genitourinary   Musculoskeletal   Abdominal   Peds  Hematology negative hematology ROS (+)   Anesthesia Other Findings   Reproductive/Obstetrics (+) Pregnancy (h/o c/s x1)                           Anesthesia Physical Anesthesia Plan  ASA: II  Anesthesia Plan: Spinal   Post-op Pain Management:    Induction:   Airway Management Planned:   Additional Equipment:   Intra-op Plan:   Post-operative Plan:   Informed Consent: I have reviewed the patients History and Physical, chart, labs and discussed the procedure including the risks, benefits and alternatives for the proposed anesthesia with the patient or authorized representative who has indicated his/her understanding and acceptance.   Dental Advisory Given  Plan Discussed with: Surgeon and CRNA  Anesthesia Plan Comments:         Anesthesia Quick Evaluation

## 2012-02-13 LAB — CBC
HCT: 31.7 % — ABNORMAL LOW (ref 36.0–46.0)
Hemoglobin: 10.4 g/dL — ABNORMAL LOW (ref 12.0–15.0)
MCH: 29.7 pg (ref 26.0–34.0)
MCHC: 33.1 g/dL (ref 30.0–36.0)
MCV: 89.5 fL (ref 78.0–100.0)
Platelets: 209 10*3/uL (ref 150–400)
RBC: 3.54 MIL/uL — ABNORMAL LOW (ref 3.87–5.11)
RDW: 13.8 % (ref 11.5–15.5)
WBC: 9.6 10*3/uL (ref 4.0–10.5)

## 2012-02-13 MED ORDER — FERROUS SULFATE 300 (60 FE) MG/5ML PO SYRP
300.0000 mg | ORAL_SOLUTION | Freq: Every day | ORAL | Status: DC
  Administered 2012-02-13 – 2012-02-14 (×2): 300 mg via ORAL
  Filled 2012-02-13 (×2): qty 5

## 2012-02-13 MED ORDER — FERROUS SULFATE 220 (44 FE) MG/5ML PO ELIX: 220.0000 mg | ORAL_SOLUTION | Freq: Every day | ORAL | Status: DC

## 2012-02-13 NOTE — Progress Notes (Signed)
Post Partum Day 1 Subjective: no complaints and tolerating PO  Objective: Blood pressure 130/77, pulse 91, temperature 98.4 F (36.9 C), temperature source Oral, resp. rate 20, height 5\' 9"  (1.753 m), weight 227 lb 6.4 oz (103.148 kg), SpO2 93.00%, unknown if currently breastfeeding.  Physical Exam:  General: alert, cooperative and no distress Lochia: appropriate Uterine Fundus: firm Episiotomy, laceration : no complaints DVT Evaluation: No evidence of DVT seen on physical exam.   Basename 02/13/12 0525 02/12/12 0932  HGB 10.4* 13.0  HCT 31.7* 39.1    Assessment/Plan: Plan for discharge tomorrow Iron   LOS: 1 day   Zonie Crutcher E 02/13/2012, 12:28 PM

## 2012-02-14 ENCOUNTER — Encounter (HOSPITAL_COMMUNITY)
Admission: RE | Admit: 2012-02-14 | Discharge: 2012-02-14 | Disposition: A | Discharge status: Home / Self Care | Payer: Managed Care, Other (non HMO) | Source: Ambulatory Visit | Attending: Obstetrics and Gynecology | Admitting: Obstetrics and Gynecology

## 2012-02-14 DIAGNOSIS — O923 Agalactia: Secondary | ICD-10-CM | POA: Insufficient documentation

## 2012-02-14 NOTE — Progress Notes (Signed)
Post Partum Day 2 Subjective: no complaints and tolerating PO  Objective: Blood pressure 111/78, pulse 71, temperature 98.2 F (36.8 C), temperature source Oral, resp. rate 18, height 5\' 9"  (1.753 m), weight 227 lb 6.4 oz (103.148 kg), SpO2 93.00%, unknown if currently breastfeeding.  Physical Exam:  General: alert, cooperative and no distress Lochia: appropriate Uterine Fundus: firm Episiotomy, laceration : no complaints DVT Evaluation: No evidence of DVT seen on physical exam.   Basename 02/13/12 0525 02/12/12 0932  HGB 10.4* 13.0  HCT 31.7* 39.1    Assessment/Plan: Discharge home   LOS: 2 days   Margaret Kim E 02/14/2012, 9:24 AM

## 2012-02-14 NOTE — Discharge Summary (Signed)
Obstetric Discharge Summary Reason for Admission: onset of labor, prior Cesarean, for VBAC Prenatal Procedures: none Intrapartum Procedures: spontaneous vaginal delivery Postpartum Procedures: none Complications-Operative and Postpartum: none  Hemoglobin  Date Value Range Status  02/13/2012 10.4* 12.0-15.0 (g/dL) Final     REPEATED TO VERIFY     DELTA CHECK NOTED     HCT  Date Value Range Status  02/13/2012 31.7* 36.0-46.0 (%) Final    Discharge Diagnoses: Term Pregnancy-delivered and VBAC, anemia  Discharge Information: Date: 02/14/2012 Activity: pelvic rest Diet: routine Medications: Ibuprofen 800mg , feosol one daily, colace prn Condition: improved Instructions: refer to practice specific booklet Discharge to: home   Newborn Data: Live born  Information for the patient's newborn:  Sotiria, Keast Zanobia [[id number]]  female ; APGAR , ; weight ;  Home with mother.  Vikrant Pryce E 02/14/2012, 9:26 AM

## 2012-02-15 ENCOUNTER — Encounter (HOSPITAL_COMMUNITY): Payer: Self-pay | Admitting: Obstetrics and Gynecology

## 2012-02-16 ENCOUNTER — Ambulatory Visit (HOSPITAL_COMMUNITY)
Admit: 2012-02-16 | Discharge: 2012-02-16 | Disposition: A | Discharge status: Home / Self Care | Payer: Managed Care, Other (non HMO) | Attending: Obstetrics and Gynecology | Admitting: Obstetrics and Gynecology

## 2012-02-16 NOTE — Progress Notes (Signed)
Adult Lactation Consultation Outpatient Visit Note  Patient Name: [facility] Boy Shanena Pellegrino, DOB [birth year] now 50 days old, Birth Weight 7 lb 3 oz.  Date of Birth: 19-Dec-1975 Gestational Age at Delivery: [redacted]w[redacted]d Type of Delivery: Vacuum Assisted Vaginal Delivery  Breastfeeding History: Frequency of Breastfeeding: every 2 1/2 to 3 hours or 8-9 times/day Length of Feeding: 15 minutes both breasts Voids: 6-8/day Stools: 6-8/day  Green/yellow  Supplementing / Method: Pumping:  Type of Pump:  Symphony DEBP   Frequency:  After each feeding for 15 minutes  Volume:  30 ml combined from both breasts  Comments: Mom is here for feeding assessment. She went home supplementing by finger feeding using an SNS after each feed 15-20 ml of EBM. Has used formula 2-3 times the first day home from the hospital. Mom has history of low milk supply with first baby which was a C/S and mom/baby were separated for the first approx. 6 hours. She breast fed and supplemented her first child for 6 months. No medical history that should affect milk supply. Mom reports she is noticing breast changes. Baby Maisie Fus is jaundiced, had a bili level drawn this afternoon. His delivery was vacuum assisted vaginal delivery. He has bruising event on the top of his head. Mom reports he is sleepy at the breast.    Consultation Evaluation: At this feeding, assisted mom with obtaining a deeper latch and bringing the bottom lip down. Baby Thomas nursed with good rhythmic suck, swallows audible with mom massaging breast to keep him active at the breast. He would become sleepy, but with breast massage and stimulating him, he would become more active at the breast.   Initial Feeding Assessment: Pre-feed Weight:  6 lb 11.3 oz/ 3042 gm Post-feed Weight:  6 lb. 11.9 oz/ 3060 gm Amount Transferred:  18 ml from left breast with nursing for 20 minutes Comments:  Additional Feeding  Assessment: Pre-feed Weight:   6 lb 11.9 oz/ 3060 gm Post-feed Weight:  6 lb. 12.5 oz/ 3078 gm Amount Transferred:  18 ml after nursing on right breast for 15 minutes Comments:  Additional Feeding Assessment: Pre-feed Weight:  6 lb. 12. 5 oz/ 3078 gm Post-feed Weight:  6 lb. 12. 8 oz/3086 gm.  Amount Transferred:   8ml with nursing an additional 15 minutes on left breast Comments:  Total Breast milk Transferred this Visit: 44 ml.  Total Supplement Given: None  Additional Interventions: Baby Maisie Fus demonstrated a good feeding this visit. Advised mom to nurse skin-to-skin, be sure bottom lip is down and listen for swallows. Advised to pump 4 times/day and given baby Maisie Fus back any amount of EBM she obtains. If he continues to breast feed well then supplement 4 times/day or if he is acting hunger after breast feeding. Post pump 4 times/day to encourage and protect milk supply with her history of low milk supply. Mom reports the finger feeding with the SNS is going well. Advised to continue this method with supplementing for now. The [facility] RN is coming to her home on Thursday, April 18th and mom plans to come the our support group next Tuesday for weight check. She knows to call and re-schedule an out patient appointment if she has concerns   Follow-Up  prn  Alfred Levins 02/16/2012, 4:46 PM

## 2012-02-17 ENCOUNTER — Inpatient Hospital Stay (HOSPITAL_COMMUNITY)
Admission: RE | Admit: 2012-02-17 | Payer: Managed Care, Other (non HMO) | Source: Ambulatory Visit | Admitting: Obstetrics and Gynecology

## 2012-02-17 ENCOUNTER — Encounter (HOSPITAL_COMMUNITY): Admission: RE | Payer: Self-pay | Source: Ambulatory Visit

## 2012-02-17 SURGERY — Surgical Case
Anesthesia: Regional

## 2012-02-19 ENCOUNTER — Ambulatory Visit (HOSPITAL_COMMUNITY)
Admission: RE | Admit: 2012-02-19 | Discharge: 2012-02-19 | Disposition: A | Discharge status: Home / Self Care | Payer: Managed Care, Other (non HMO) | Source: Ambulatory Visit | Attending: Obstetrics and Gynecology | Admitting: Obstetrics and Gynecology

## 2012-02-19 NOTE — Progress Notes (Signed)
Adult Lactation Consultation Outpatient Visit Note  Patient Name: Margaret Kim Date of Birth: Oct 10, 1976 Gestational Age at Delivery: [redacted]w[redacted]d Type of Delivery:   Breastfeeding History: Frequency of Breastfeeding: q 1 1/2 - 3 hours Length of Feeding: has been cluster feeding the last day or so Voids: QS Stools: QS  Supplementing / Method: Pumping:  Type of Pump:Symphony rental   Frequency: 4 x/day  Volume:  75-40cc  Comments: Mom reports that she obtains the most milk first thing in the morning and less as the day goes on. Reassured that is normal. Has been using SNS with EBM and occas formula. Occasionally Dad fingers feeds at night.   Consultation Evaluation:  Initial Feeding Assessment: Pre-feed Weight:7-0.4 Post-feed Weight: Amount Transferred: Comments:Mom pleased that baby has gained weight. Has gained 6 oz since Tuesday.  Baby had nursed for 25 minutes before coming to hospital. Few swallows heard.Lots of non nutritive sucking noted even with SNS  Additional Feeding Assessment: Pre-feed Weight: Post-feed Weight: Amount Transferred: Comments:  Additional Feeding Assessment: Pre-feed Weight: Post-feed Weight: Amount Transferred: Comments:  Total Breast milk Transferred this Visit:  Total Supplement Given:   Additional Interventions: Mom plans to continue trying baby at the breast for a few more Margaret Kim. If he isn't doing better at nursing she plans to pump and bottle feed EBM. Instructed in use and cleaning of large SNS.Had tough time nursing older daughter- diff latch, weight loss and low milk supply.No questions at present.  Follow-Up To call if needs another appointment or for any questions.     Margaret Kim 02/19/2012, 3:20 PM

## 2012-02-29 ENCOUNTER — Encounter (HOSPITAL_COMMUNITY)
Admission: RE | Admit: 2012-02-29 | Discharge: 2012-02-29 | Disposition: A | Discharge status: Home / Self Care | Payer: Managed Care, Other (non HMO) | Source: Ambulatory Visit | Attending: Obstetrics and Gynecology | Admitting: Obstetrics and Gynecology

## 2012-02-29 NOTE — Progress Notes (Addendum)
Infant Lactation Consultation Outpatient Visit Note Reason for consult:  Slow weight gain, feeding difficulties  Patient Name: Margaret Kim  Baby - Margaret Kim Date of Birth: 11-13-1975                 DOB - [birth year] Birth Weight:                                7 lbs. 3 oz. Gestational Age at Delivery: Gestational Age: <None>  39 weeks Type of Delivery:  Vaginal Delivery  Breastfeeding History Frequency of Breastfeeding: every 21/2-3 hrs. Length of Feeding: 15-20 mins each side Voids:   6-8  Stools:  3-4 yellow seedy  Supplementing / Method: Pumping:  Type of Pump: Symphony   Frequency:  4 times a day  Volume:   3-4 oz each pumping  Margaret Kim has been finger feeding up to 3 oz per feeding and baby is now 28 1/2 weeks old.  She was using it as a supplement for after breast feeding but starting a couple days ago, he started refusing to latch.  Baby has gained between 1/2 and 1 oz per day with her present plan.  Mom is very concerned how long she can continue with this.  She has a 56 year old daughter who demands of her time.    Baby latched easily in the office, and appeared to be transferring milk as swallows and deep pulls were heard and seen.  Baby fed for 20 mins.  PC weight gain was 20 ml.  Switched to 2nd breast (right) and baby kept slipping onto nipple.  Initiated SNS at breast to try to facilitate a better latch.  Baby took another 20 ml, from the SNS.  Nipples look pinched pc, with some blistering noted.  Tried 2 different size (20 and 24 mm) nipple shields to experiment to see if that would help the suck.    After much time and effort, Margaret Kim decided what would work best for her would be to bottle feed her breast milk to [employer].  Talked about how to keep her milk supply adequate, and how to use the breast as a "first course or dessert".  Margaret Kim was very happy with this plan.  She knows to call us with any questions and to make an appointment if she wants another  assessment.    Follow-Up prn     Margaret Kim 02/29/2012, 4:36 PM

## 2012-03-10 ENCOUNTER — Encounter (HOSPITAL_COMMUNITY): Payer: Self-pay | Admitting: Anesthesiology

## 2012-03-16 ENCOUNTER — Encounter (HOSPITAL_COMMUNITY)
Admission: RE | Admit: 2012-03-16 | Discharge: 2012-03-16 | Disposition: A | Discharge status: Home / Self Care | Payer: Managed Care, Other (non HMO) | Source: Ambulatory Visit | Attending: Obstetrics and Gynecology | Admitting: Obstetrics and Gynecology

## 2012-03-16 DIAGNOSIS — O923 Agalactia: Secondary | ICD-10-CM | POA: Insufficient documentation

## 2012-04-16 ENCOUNTER — Encounter (HOSPITAL_COMMUNITY)
Admission: RE | Admit: 2012-04-16 | Discharge: 2012-04-16 | Disposition: A | Discharge status: Home / Self Care | Payer: Managed Care, Other (non HMO) | Source: Ambulatory Visit | Attending: Obstetrics and Gynecology | Admitting: Obstetrics and Gynecology

## 2012-04-16 DIAGNOSIS — O923 Agalactia: Secondary | ICD-10-CM | POA: Insufficient documentation

## 2012-05-17 ENCOUNTER — Encounter (HOSPITAL_COMMUNITY)
Admission: RE | Admit: 2012-05-17 | Discharge: 2012-05-17 | Disposition: A | Discharge status: Home / Self Care | Payer: Managed Care, Other (non HMO) | Source: Ambulatory Visit | Attending: Obstetrics and Gynecology | Admitting: Obstetrics and Gynecology

## 2012-05-17 DIAGNOSIS — O923 Agalactia: Secondary | ICD-10-CM | POA: Insufficient documentation

## 2012-06-17 ENCOUNTER — Encounter (HOSPITAL_COMMUNITY)
Admission: RE | Admit: 2012-06-17 | Discharge: 2012-06-17 | Disposition: A | Discharge status: Home / Self Care | Payer: Managed Care, Other (non HMO) | Source: Ambulatory Visit | Attending: Obstetrics and Gynecology | Admitting: Obstetrics and Gynecology

## 2012-06-17 DIAGNOSIS — O923 Agalactia: Secondary | ICD-10-CM | POA: Insufficient documentation

## 2012-07-18 ENCOUNTER — Encounter (HOSPITAL_COMMUNITY)
Admission: RE | Admit: 2012-07-18 | Discharge: 2012-07-18 | Disposition: A | Discharge status: Home / Self Care | Payer: Managed Care, Other (non HMO) | Source: Ambulatory Visit | Attending: Obstetrics and Gynecology | Admitting: Obstetrics and Gynecology

## 2012-07-18 DIAGNOSIS — O923 Agalactia: Secondary | ICD-10-CM | POA: Insufficient documentation

## 2012-08-18 ENCOUNTER — Encounter (HOSPITAL_COMMUNITY)
Admission: RE | Admit: 2012-08-18 | Discharge: 2012-08-18 | Disposition: A | Discharge status: Home / Self Care | Payer: Managed Care, Other (non HMO) | Source: Ambulatory Visit | Attending: Obstetrics and Gynecology | Admitting: Obstetrics and Gynecology

## 2012-08-18 DIAGNOSIS — O923 Agalactia: Secondary | ICD-10-CM | POA: Insufficient documentation

## 2012-09-18 ENCOUNTER — Encounter (HOSPITAL_COMMUNITY)
Admission: RE | Admit: 2012-09-18 | Discharge: 2012-09-18 | Disposition: A | Discharge status: Home / Self Care | Payer: Managed Care, Other (non HMO) | Source: Ambulatory Visit | Attending: Obstetrics and Gynecology | Admitting: Obstetrics and Gynecology

## 2012-09-18 DIAGNOSIS — O923 Agalactia: Secondary | ICD-10-CM | POA: Insufficient documentation

## 2012-10-18 ENCOUNTER — Encounter (HOSPITAL_COMMUNITY)
Admission: RE | Admit: 2012-10-18 | Discharge: 2012-10-18 | Disposition: A | Discharge status: Home / Self Care | Payer: Managed Care, Other (non HMO) | Source: Ambulatory Visit | Attending: Obstetrics and Gynecology | Admitting: Obstetrics and Gynecology

## 2012-10-18 DIAGNOSIS — O923 Agalactia: Secondary | ICD-10-CM | POA: Insufficient documentation

## 2012-11-18 ENCOUNTER — Encounter (HOSPITAL_COMMUNITY)
Admission: RE | Admit: 2012-11-18 | Discharge: 2012-11-18 | Disposition: A | Discharge status: Home / Self Care | Payer: Managed Care, Other (non HMO) | Source: Ambulatory Visit | Attending: Obstetrics and Gynecology | Admitting: Obstetrics and Gynecology

## 2012-11-18 DIAGNOSIS — O923 Agalactia: Secondary | ICD-10-CM | POA: Insufficient documentation

## 2012-12-19 ENCOUNTER — Encounter (HOSPITAL_COMMUNITY)
Admission: RE | Admit: 2012-12-19 | Discharge: 2012-12-19 | Disposition: A | Discharge status: Home / Self Care | Payer: Managed Care, Other (non HMO) | Source: Ambulatory Visit | Attending: Obstetrics and Gynecology | Admitting: Obstetrics and Gynecology

## 2012-12-19 DIAGNOSIS — O923 Agalactia: Secondary | ICD-10-CM | POA: Insufficient documentation

## 2013-01-17 ENCOUNTER — Encounter (HOSPITAL_COMMUNITY)
Admission: RE | Admit: 2013-01-17 | Discharge: 2013-01-17 | Disposition: A | Discharge status: Home / Self Care | Payer: Managed Care, Other (non HMO) | Source: Ambulatory Visit | Attending: Obstetrics and Gynecology | Admitting: Obstetrics and Gynecology

## 2013-01-17 DIAGNOSIS — O923 Agalactia: Secondary | ICD-10-CM | POA: Insufficient documentation

## 2013-02-17 ENCOUNTER — Encounter (HOSPITAL_COMMUNITY)
Admission: RE | Admit: 2013-02-17 | Discharge: 2013-02-17 | Disposition: A | Discharge status: Home / Self Care | Payer: Managed Care, Other (non HMO) | Source: Ambulatory Visit | Attending: Obstetrics and Gynecology | Admitting: Obstetrics and Gynecology

## 2013-02-17 DIAGNOSIS — O923 Agalactia: Secondary | ICD-10-CM | POA: Insufficient documentation

## 2013-03-20 ENCOUNTER — Encounter (HOSPITAL_COMMUNITY)
Admission: RE | Admit: 2013-03-20 | Discharge: 2013-03-20 | Disposition: A | Discharge status: Home / Self Care | Payer: Managed Care, Other (non HMO) | Source: Ambulatory Visit | Attending: Obstetrics and Gynecology | Admitting: Obstetrics and Gynecology

## 2013-03-20 DIAGNOSIS — O923 Agalactia: Secondary | ICD-10-CM | POA: Insufficient documentation

## 2013-09-02 ENCOUNTER — Encounter (HOSPITAL_COMMUNITY): Payer: Self-pay | Admitting: Emergency Medicine

## 2013-09-02 ENCOUNTER — Emergency Department (HOSPITAL_COMMUNITY)
Admission: EM | Admit: 2013-09-02 | Discharge: 2013-09-02 | Disposition: A | Discharge status: Home / Self Care | Payer: Managed Care, Other (non HMO) | Attending: Emergency Medicine | Admitting: Emergency Medicine

## 2013-09-02 ENCOUNTER — Emergency Department (HOSPITAL_COMMUNITY): Payer: Managed Care, Other (non HMO)

## 2013-09-02 DIAGNOSIS — R059 Cough, unspecified: Secondary | ICD-10-CM | POA: Insufficient documentation

## 2013-09-02 DIAGNOSIS — R05 Cough: Secondary | ICD-10-CM

## 2013-09-02 DIAGNOSIS — R079 Chest pain, unspecified: Secondary | ICD-10-CM | POA: Insufficient documentation

## 2013-09-02 DIAGNOSIS — Z8619 Personal history of other infectious and parasitic diseases: Secondary | ICD-10-CM | POA: Insufficient documentation

## 2013-09-02 DIAGNOSIS — R0781 Pleurodynia: Secondary | ICD-10-CM

## 2013-09-02 DIAGNOSIS — M549 Dorsalgia, unspecified: Secondary | ICD-10-CM | POA: Insufficient documentation

## 2013-09-02 LAB — CBC WITH DIFFERENTIAL/PLATELET
Basophils Absolute: 0 10*3/uL (ref 0.0–0.1)
Basophils Relative: 0 % (ref 0–1)
Eosinophils Absolute: 0.1 10*3/uL (ref 0.0–0.7)
Eosinophils Relative: 2 % (ref 0–5)
HCT: 40.7 % (ref 36.0–46.0)
Hemoglobin: 13.7 g/dL (ref 12.0–15.0)
Lymphocytes Relative: 30 % (ref 12–46)
Lymphs Abs: 2 10*3/uL (ref 0.7–4.0)
MCH: 29.7 pg (ref 26.0–34.0)
MCHC: 33.7 g/dL (ref 30.0–36.0)
MCV: 88.3 fL (ref 78.0–100.0)
Monocytes Absolute: 0.6 10*3/uL (ref 0.1–1.0)
Monocytes Relative: 9 % (ref 3–12)
Neutro Abs: 4 10*3/uL (ref 1.7–7.7)
Neutrophils Relative %: 59 % (ref 43–77)
Platelets: 288 10*3/uL (ref 150–400)
RBC: 4.61 MIL/uL (ref 3.87–5.11)
RDW: 12.4 % (ref 11.5–15.5)
WBC: 6.7 10*3/uL (ref 4.0–10.5)

## 2013-09-02 LAB — BASIC METABOLIC PANEL
BUN: 10 mg/dL (ref 6–23)
CO2: 26 mEq/L (ref 19–32)
Calcium: 9.2 mg/dL (ref 8.4–10.5)
Chloride: 101 mEq/L (ref 96–112)
Creatinine, Ser: 0.73 mg/dL (ref 0.50–1.10)
GFR calc Af Amer: >90 mL/min (ref >90)
GFR calc non Af Amer: >90 mL/min (ref >90)
Glucose, Bld: 98 mg/dL (ref 70–99)
Potassium: 3.8 mEq/L (ref 3.5–5.1)
Sodium: 135 mEq/L (ref 135–145)

## 2013-09-02 MED ORDER — IOHEXOL 350 MG/ML SOLN
100.0000 mL | Freq: Once | INTRAVENOUS | Status: AC | PRN
  Administered 2013-09-02: 100 mL via INTRAVENOUS

## 2013-09-02 NOTE — ED Provider Notes (Signed)
CSN: [id number]     Arrival date & time 09/02/13  1411 History   None    Chief Complaint  Patient presents with  . Chest Pain    rib    HPI  Marisha E Hlavac is a 37 y.o. female with no PMH who presents to the ED for evaluation of chest pain.  History was provided by the patient.  Patient states that she has had a cough for the past 8 weeks.  Her cough is worse at night.  She states that it was improving after a few weeks, but worsened after coming back from a trip ([place]) last Monday.  She states that she has pain with coughing to the right ribs with radiation towards her upper back. She has a wet cough but no sputum production.  No hemoptysis.  She states she took OTC Claritin for a possible allergic etiology of the cough, which helped her initially.  She has not been on any antibiotics for this cough. She went to an UC today for her worsening symptoms and they did a d-dimer which was elevated at (0.54).  She was sent to the ED for further evaluation and management.  She has some sinus pressure, but not other complaints.  She denies any fever, CP, SOB, lightheadedness, headache, sore throat, rhinorrhea, abdominal pain, nausea, emesis, leg edema/calf pain.  No recent surgeries, trauma/injuries, hx of DVT/PT/clotting, FH of clotting, estrogen use, tobacco use, or cardiac disease.     Past Medical History  Diagnosis Date  . Staph infection 02-11-12    started meds after positve nasal swab   Past Surgical History  Procedure Laterality Date  . Abdominal adhesion surgery      removed in may 2012  . Cesarean section    . Dilation and curettage of uterus      sab x2 8weeks  . Cesarean section  02/12/2012    Procedure: CESAREAN SECTION;  Surgeon: Dorien Chihuahua. Richardson Dopp, MD;  Location: [facility];  Service: Gynecology;  Laterality: N/A;   Family History  Problem Relation Age of Onset  . Learning disabilities Sister   . Miscarriages / Stillbirths Paternal Aunt   . Congenital heart disease Cousin   . Mental  retardation Cousin   . Sickle cell trait Cousin   . Other Cousin     brain abnormality   History  Substance Use Topics  . Smoking status: Never Smoker   . Smokeless tobacco: Never Used  . Alcohol Use: No   OB History   Grav Para Term Preterm Abortions TAB SAB Ect Mult Living   4 2 2  2  2   2      Review of Systems  Constitutional: Negative for fever, chills, diaphoresis, activity change, appetite change and fatigue.  HENT: Positive for sinus pressure. Negative for congestion, ear pain, rhinorrhea, sore throat and trouble swallowing.   Eyes: Negative for visual disturbance.  Respiratory: Positive for cough. Negative for chest tightness, shortness of breath and wheezing.   Cardiovascular: Positive for chest pain. Negative for palpitations and leg swelling.  Gastrointestinal: Negative for nausea, vomiting and abdominal pain.  Genitourinary: Negative for dysuria.  Musculoskeletal: Positive for back pain. Negative for gait problem, myalgias and neck pain.  Skin: Negative for wound.  Neurological: Negative for dizziness, weakness, light-headedness, numbness and headaches.    Allergies  Sulfa antibiotics  Home Medications  No current outpatient prescriptions on file. BP 112/58  Pulse 60  Temp(Src) 98.1 F (36.7 C) (Oral)  Resp 16  SpO2 99%  LMP 08/19/2013  Breastfeeding? No  Filed Vitals:   09/02/13 1419 09/02/13 1435 09/02/13 1659 09/02/13 1731  BP: 132/79 112/58 129/70 126/77  Pulse: 61 60 66 61  Temp: 97.8 F (36.6 C) 98.1 F (36.7 C)  98 F (36.7 C)  TempSrc: Oral Oral  Oral  Resp: 18 16 16    SpO2: 98% 99% 100% 100%    Physical Exam  Nursing note and vitals reviewed. Constitutional: She is oriented to person, place, and time. She appears well-developed and well-nourished. No distress.  HENT:  Head: Normocephalic and atraumatic.  Right Ear: External ear normal.  Left Ear: External ear normal.  Nose: Nose normal.  Mouth/Throat: Oropharynx is clear and moist.   No sinus tenderness throughout. Tympanic membranes gray and translucent bilaterally  Eyes: Conjunctivae are normal. Right eye exhibits no discharge. Left eye exhibits no discharge.  Neck: Normal range of motion. Neck supple.  Cardiovascular: Normal rate, regular rhythm, normal heart sounds and intact distal pulses.  Exam reveals no gallop and no friction rub.   No murmur heard. Dorsalis pedis pulses present bilaterally  Pulmonary/Chest: Effort normal and breath sounds normal. No respiratory distress. She has no wheezes. She has no rales. She exhibits tenderness.  Tenderness to palpation to the right lateral chest underneath her right breast.  No palpable step-offs or overlying ecchymosis, edema, or erythema.  Wet non-productive cough throughout exam.   Abdominal: Soft. Bowel sounds are normal. She exhibits no distension and no mass. There is no tenderness. There is no rebound and no guarding.  Musculoskeletal: Normal range of motion. She exhibits no edema and no tenderness.  No pedal edema or calf tenderness bilaterally  Neurological: She is alert and oriented to person, place, and time.  Skin: Skin is warm and dry. She is not diaphoretic.    ED Course  Procedures (including critical care time) Labs Review Labs Reviewed - No data to display Imaging Review No results found.  EKG Interpretation     Ventricular Rate:  57 PR Interval:  163 QRS Duration: 93 QT Interval:  440 QTC Calculation: 428 R Axis:   56 Text Interpretation:  Sinus rhythm ED PHYSICIAN INTERPRETATION AVAILABLE IN [facility]           Results for orders placed during the hospital encounter of 09/02/13  CBC WITH DIFFERENTIAL      Result Value Range   WBC 6.7  4.0 - 10.5 K/uL   RBC 4.61  3.87 - 5.11 MIL/uL   Hemoglobin 13.7  12.0 - 15.0 g/dL   HCT 14.7  82.9 - 56.2 %   MCV 88.3  78.0 - 100.0 fL   MCH 29.7  26.0 - 34.0 pg   MCHC 33.7  30.0 - 36.0 g/dL   RDW 13.0  86.5 - 78.4 %   Platelets 288  150  - 400 K/uL   Neutrophils Relative % 59  43 - 77 %   Neutro Abs 4.0  1.7 - 7.7 K/uL   Lymphocytes Relative 30  12 - 46 %   Lymphs Abs 2.0  0.7 - 4.0 K/uL   Monocytes Relative 9  3 - 12 %   Monocytes Absolute 0.6  0.1 - 1.0 K/uL   Eosinophils Relative 2  0 - 5 %   Eosinophils Absolute 0.1  0.0 - 0.7 K/uL   Basophils Relative 0  0 - 1 %   Basophils Absolute 0.0  0.0 - 0.1 K/uL  BASIC METABOLIC PANEL  Result Value Range   Sodium 135  135 - 145 mEq/L   Potassium 3.8  3.5 - 5.1 mEq/L   Chloride 101  96 - 112 mEq/L   CO2 26  19 - 32 mEq/L   Glucose, Bld 98  70 - 99 mg/dL   BUN 10  6 - 23 mg/dL   Creatinine, Ser 1.61  0.50 - 1.10 mg/dL   Calcium 9.2  8.4 - 09.6 mg/dL   GFR calc non Af Amer >90  >90 mL/min   GFR calc Af Amer >90  >90 mL/min    CT Angio Chest PE W/Cm &/Or Wo Cm (Final result)  Result time: 09/02/13 16:34:44    Final result by Rad Results In Interface (09/02/13 16:34:44)    Narrative:   CLINICAL DATA: Right-sided chest pain and cough. Recent airplane travel. Elevated D-dimer. Clinical suspicion for pulmonary embolism.  EXAM: CT ANGIOGRAPHY CHEST WITH CONTRAST  TECHNIQUE: Multidetector CT imaging of the chest was performed using the standard protocol during bolus administration of intravenous contrast. Multiplanar CT image reconstructions including MIPs were obtained to evaluate the vascular anatomy.  CONTRAST: OMNIPAQUE IOHEXOL 350 MG/ML SOLN  COMPARISON: None.  FINDINGS: Satisfactory opacification of pulmonary arteries noted, and there is no evidence of pulmonary emboli. No evidence of thoracic aortic dissection or aneurysm. No evidence of mediastinal hematoma or mass. No lymphadenopathy identified within the thorax.  No evidence of pleural or pericardial effusion. No evidence of pulmonary infiltrate or central endobronchial obstruction. No suspicious pulmonary nodules or masses are identified. No evidence of chest wall mass or suspicious bone  lesions.  Review of the MIP images confirms the above findings.  IMPRESSION: Negative. No evidence of pulmonary embolism or other active disease.   Electronically Signed By: Myles Rosenthal M.D. On: 09/02/2013 16:34            MDM   1. Cough   2. Rib pain on right side     Ginette E Mantell is a 37 y.o. female with no PMH who presents to the ED for evaluation of chest pain.  EKG, BMP, CBC ordered.  Will order CT angio pending results of creatinine.    Rechecks  5:30 PM = Patient informed of results.  Patient asymptomatic.  No concerns.     Etiology of chest pain possibly musculoskeletal in nature from coughing.  Her pain was reproducible to palpation and worse with coughing.  Patient's CT angio negative for pulmonary embolism, fracture, pleural effusion, or pulmonary infiltrate.  EKG negative for acute ischemic changes.  Patient afebrile and remained in no acute distress throughout her ED visit.  Vital signs stable.  Follow-up, discharge instructions, and return precautions given.  Patient in agreement with discharge and plan.     Final impressions: 1. Cough  2. Rib pain, right     Luiz Iron PA-C   This patient was discussed with Dr. Wenda Low, PA-C 09/04/13 1148

## 2013-09-02 NOTE — ED Notes (Addendum)
Pt from home reports R rib pain x 1week. Pt was seen at PCP, D-dimer 0.54, PCP suspicious of blood clot since pt has had recent air travel and wants pt to have scan. Pt has had cough x8 weeks that was dx as allergies. Pt has pain in her R rib when she coughs. Pt denies SOB, CP, dizziness. Pt is A&O and in NAD

## 2013-09-07 NOTE — ED Provider Notes (Signed)
Medical screening examination/treatment/procedure(s) were performed by non-physician practitioner and as supervising physician I was immediately available for consultation/collaboration.  EKG Interpretation     Ventricular Rate:  57 PR Interval:  163 QRS Duration: 93 QT Interval:  440 QTC Calculation: 428 R Axis:   56 Text Interpretation:  Sinus rhythm ED PHYSICIAN INTERPRETATION AVAILABLE IN [facility] Tacey Ruiz, MD 09/07/13 [phone]

## 2014-09-03 ENCOUNTER — Encounter (HOSPITAL_COMMUNITY): Payer: Self-pay | Admitting: Emergency Medicine

## 2014-10-03 ENCOUNTER — Other Ambulatory Visit: Payer: Self-pay | Admitting: Family Medicine

## 2014-10-03 DIAGNOSIS — R2231 Localized swelling, mass and lump, right upper limb: Secondary | ICD-10-CM

## 2014-10-09 ENCOUNTER — Ambulatory Visit (INDEPENDENT_AMBULATORY_CARE_PROVIDER_SITE_OTHER): Payer: BC Managed Care – PPO

## 2014-10-09 DIAGNOSIS — R2231 Localized swelling, mass and lump, right upper limb: Secondary | ICD-10-CM

## 2014-10-10 ENCOUNTER — Encounter (HOSPITAL_COMMUNITY): Payer: Self-pay | Admitting: Emergency Medicine

## 2014-10-10 ENCOUNTER — Emergency Department (HOSPITAL_COMMUNITY)
Admission: EM | Admit: 2014-10-10 | Discharge: 2014-10-10 | Disposition: A | Discharge status: Home / Self Care | Payer: BC Managed Care – PPO | Attending: Emergency Medicine | Admitting: Emergency Medicine

## 2014-10-10 ENCOUNTER — Emergency Department (HOSPITAL_COMMUNITY): Payer: BC Managed Care – PPO

## 2014-10-10 DIAGNOSIS — R079 Chest pain, unspecified: Secondary | ICD-10-CM

## 2014-10-10 DIAGNOSIS — E780 Pure hypercholesterolemia: Secondary | ICD-10-CM | POA: Insufficient documentation

## 2014-10-10 DIAGNOSIS — Z8619 Personal history of other infectious and parasitic diseases: Secondary | ICD-10-CM | POA: Diagnosis not present

## 2014-10-10 DIAGNOSIS — R0602 Shortness of breath: Secondary | ICD-10-CM | POA: Diagnosis not present

## 2014-10-10 DIAGNOSIS — Z8659 Personal history of other mental and behavioral disorders: Secondary | ICD-10-CM | POA: Insufficient documentation

## 2014-10-10 DIAGNOSIS — Z79899 Other long term (current) drug therapy: Secondary | ICD-10-CM | POA: Diagnosis not present

## 2014-10-10 DIAGNOSIS — R0789 Other chest pain: Secondary | ICD-10-CM | POA: Diagnosis not present

## 2014-10-10 HISTORY — DX: Major depressive disorder, single episode, unspecified: F32.9

## 2014-10-10 HISTORY — DX: Pure hypercholesterolemia, unspecified: E78.00

## 2014-10-10 HISTORY — DX: Depression, unspecified: F32.A

## 2014-10-10 LAB — BASIC METABOLIC PANEL
Anion gap: 14 (ref 5–15)
BUN: 10 mg/dL (ref 6–23)
CO2: 20 mEq/L (ref 19–32)
Calcium: 8.8 mg/dL (ref 8.4–10.5)
Chloride: 104 mEq/L (ref 96–112)
Creatinine, Ser: 0.79 mg/dL (ref 0.50–1.10)
GFR calc Af Amer: >90 mL/min (ref >90)
GFR calc non Af Amer: >90 mL/min (ref >90)
Glucose, Bld: 104 mg/dL — ABNORMAL HIGH (ref 70–99)
Potassium: 4.5 mEq/L (ref 3.7–5.3)
Sodium: 138 mEq/L (ref 137–147)

## 2014-10-10 LAB — I-STAT TROPONIN, ED
Troponin i, poc: 0 ng/mL (ref 0.00–0.08)
Troponin i, poc: 0 ng/mL (ref 0.00–0.08)

## 2014-10-10 LAB — CBC
HCT: 40.6 % (ref 36.0–46.0)
Hemoglobin: 13.6 g/dL (ref 12.0–15.0)
MCH: 30 pg (ref 26.0–34.0)
MCHC: 33.5 g/dL (ref 30.0–36.0)
MCV: 89.4 fL (ref 78.0–100.0)
Platelets: 223 10*3/uL (ref 150–400)
RBC: 4.54 MIL/uL (ref 3.87–5.11)
RDW: 12.5 % (ref 11.5–15.5)
WBC: 5.3 10*3/uL (ref 4.0–10.5)

## 2014-10-10 LAB — PRO B NATRIURETIC PEPTIDE: Pro B Natriuretic peptide (BNP): 71 pg/mL (ref 0–125)

## 2014-10-10 MED ORDER — NITROGLYCERIN 0.4 MG SL SUBL
0.4000 mg | SUBLINGUAL_TABLET | SUBLINGUAL | Status: DC | PRN
  Administered 2014-10-10: 0.4 mg via SUBLINGUAL
  Filled 2014-10-10: qty 1

## 2014-10-10 MED ORDER — ASPIRIN 81 MG PO CHEW
324.0000 mg | CHEWABLE_TABLET | Freq: Once | ORAL | Status: AC
  Administered 2014-10-10: 324 mg via ORAL
  Filled 2014-10-10: qty 4

## 2014-10-10 MED ORDER — SODIUM CHLORIDE 0.9 % IV SOLN
1000.0000 mL | Freq: Once | INTRAVENOUS | Status: AC
  Administered 2014-10-10: 1000 mL via INTRAVENOUS

## 2014-10-10 MED ORDER — ASPIRIN 81 MG PO CHEW: 81.0000 mg | CHEWABLE_TABLET | Freq: Every day | ORAL | Status: DC

## 2014-10-10 NOTE — ED Notes (Signed)
Pt ambulated with steady gait and denied SHOB while ambulating around the department.

## 2014-10-10 NOTE — ED Notes (Signed)
Pt had ultrasound on lump under her right arm yesterday.

## 2014-10-10 NOTE — ED Notes (Signed)
Made MD aware pt BP 71/38, HR 37 after Nitro.

## 2014-10-10 NOTE — Discharge Instructions (Signed)
We saw you in the ER for the chest pain/shortness of breath. All of our cardiac workup is normal, including labs, EKG and chest X-RAY are normal. We are not sure what is causing your discomfort, but we feel comfortable sending you home at this time with a Cardiology follow up. Please call the number provided if you dont hear from the Cardiology team by tomorrow afternoon for a follow up. RETURN TO THE ER IF THERE IS ANY INCREASED CHEST PAIN, SWEATING, WORSENING SHORTNESS OF BREATH, OR FAINTING/NEAR FAINTING.   Chest Pain (Nonspecific) It is often hard to give a specific diagnosis for the cause of chest pain. There is always a chance that your pain could be related to something serious, such as a heart attack or a blood clot in the lungs. You need to follow up with your health care provider for further evaluation. CAUSES   Heartburn.  Pneumonia or bronchitis.  Anxiety or stress.  Inflammation around your heart (pericarditis) or lung (pleuritis or pleurisy).  A blood clot in the lung.  A collapsed lung (pneumothorax). It can develop suddenly on its own (spontaneous pneumothorax) or from trauma to the chest.  Shingles infection (herpes zoster virus). The chest wall is composed of bones, muscles, and cartilage. Any of these can be the source of the pain.  The bones can be bruised by injury.  The muscles or cartilage can be strained by coughing or overwork.  The cartilage can be affected by inflammation and become sore (costochondritis). DIAGNOSIS  Lab tests or other studies may be needed to find the cause of your pain. Your health care provider may have you take a test called an ambulatory electrocardiogram (ECG). An ECG records your heartbeat patterns over a 24-hour period. You may also have other tests, such as:  Transthoracic echocardiogram (TTE). During echocardiography, sound waves are used to evaluate how blood flows through your heart.  Transesophageal echocardiogram  (TEE).  Cardiac monitoring. This allows your health care provider to monitor your heart rate and rhythm in real time.  Holter monitor. This is a portable device that records your heartbeat and can help diagnose heart arrhythmias. It allows your health care provider to track your heart activity for several days, if needed.  Stress tests by exercise or by giving medicine that makes the heart beat faster. TREATMENT   Treatment depends on what may be causing your chest pain. Treatment may include:  Acid blockers for heartburn.  Anti-inflammatory medicine.  Pain medicine for inflammatory conditions.  Antibiotics if an infection is present.  You may be advised to change lifestyle habits. This includes stopping smoking and avoiding alcohol, caffeine, and chocolate.  You may be advised to keep your head raised (elevated) when sleeping. This reduces the chance of acid going backward from your stomach into your esophagus. Most of the time, nonspecific chest pain will improve within 2-3 days with rest and mild pain medicine.  HOME CARE INSTRUCTIONS   If antibiotics were prescribed, take them as directed. Finish them even if you start to feel better.  For the next few days, avoid physical activities that bring on chest pain. Continue physical activities as directed.  Do not use any tobacco products, including cigarettes, chewing tobacco, or electronic cigarettes.  Avoid drinking alcohol.  Only take medicine as directed by your health care provider.  Follow your health care provider's suggestions for further testing if your chest pain does not go away.  Keep any follow-up appointments you made. If you do not go  to an appointment, you could develop lasting (chronic) problems with pain. If there is any problem keeping an appointment, call to reschedule. SEEK MEDICAL CARE IF:   Your chest pain does not go away, even after treatment.  You have a rash with blisters on your chest.  You have  a fever. SEEK IMMEDIATE MEDICAL CARE IF:   You have increased chest pain or pain that spreads to your arm, neck, jaw, back, or abdomen.  You have shortness of breath.  You have an increasing cough, or you cough up blood.  You have severe back or abdominal pain.  You feel nauseous or vomit.  You have severe weakness.  You faint.  You have chills. This is an emergency. Do not wait to see if the pain will go away. Get medical help at once. Call your local emergency services (911 in [place] Do not drive yourself to the hospital. MAKE SURE YOU:   Understand these instructions.  Will watch your condition.  Will get help right away if you are not doing well or get worse. Document Released: 07/29/2005 Document Revised: 10/24/2013 Document Reviewed: 05/24/2008 [facility] Patient Information 2015 [place], [place]. This information is not intended to replace advice given to you by your health care provider. Make sure you discuss any questions you have with your health care provider.  Aspirin and Your Heart Aspirin affects the way your blood clots and helps "thin" the blood. Aspirin has many uses in heart disease. It may be used as a primary prevention to help reduce the risk of heart related events. It also can be used as a secondary measure to prevent more heart attacks or to prevent additional damage from blood clots.  ASPIRIN MAY HELP IF YOU:  Have had a heart attack or chest pain.  Have undergone open heart surgery such as CABG (Coronary Artery Bypass Surgery).  Have had coronary angioplasty with or without stents.  Have experienced a stroke or TIA (transient ischemic attack).  Have peripheral vascular disease (PAD).  Have chronic heart rhythm problems such as atrial fibrillation.  Are at risk for heart disease. BEFORE STARTING ASPIRIN Before you start taking aspirin, your caregiver will need to review your medical history. Many things will need to be taken into consideration,  such as:  Smoking status.  Blood pressure.  Diabetes.  Gender.  Weight.  Cholesterol level. ASPIRIN DOSES  Aspirin should only be taken on the advice of your caregiver. Talk to your caregiver about how much aspirin you should take. Aspirin comes in different doses such as:  81 mg.  162 mg.  325 mg.  The aspirin dose you take may be affected by many factors, some of which include:  Your current medications, especially if your are taking blood-thinners or anti-platelet medicine.  Liver function.  Heart disease risk.  Age.  Aspirin comes in two forms:  Non-enteric-coated. This type of aspirin does not have a coating and is absorbed faster. Non-enteric coated aspirin is recommended for patients experiencing chest pain symptoms. This type of aspirin also comes in a chewable form.  Enteric-coated. This means the aspirin has a special coating that releases the medicine very slowly. Enteric-coated aspirin causes less stomach upset. This type of aspirin should not be chewed or crushed. ASPIRIN SIDE EFFECTS Daily use of aspirin can increase your risk of serious side effects. Some of these include:  Increased bleeding. This can range from a cut that does not stop bleeding to more serious problems such as stomach bleeding or  bleeding into the brain (Intracerebral bleeding).  Increased bruising.  Stomach upset.  An allergic reaction such as red, itchy skin.  Increased risk of bleeding when combined with non-steroidal anti-inflammatory medicine (NSAIDS).  Alcohol should be drank in moderation when taking aspirin. Alcohol can increase the risk of stomach bleeding when taken with aspirin.  Aspirin should not be given to children less than 38 years of age due to the association of Reye syndrome. Reye syndrome is a serious illness that can affect the brain and liver. Studies have linked Reye syndrome with aspirin use in children.  People that have nasal polyps have an increased  risk of developing an aspirin allergy. SEEK MEDICAL CARE IF:   You develop an allergic reaction such as:  Hives.  Itchy skin.  Swelling of the lips, tongue or face.  You develop stomach pain.  You have unusual bleeding or bruising.  You have ringing in your ears. SEEK IMMEDIATE MEDICAL CARE IF:   You have severe chest pain, especially if the pain is crushing or pressure-like and spreads to the arms, back, neck, or jaw. THIS IS AN EMERGENCY. Do not wait to see if the pain will go away. Get medical help at once. Call your local emergency services (911 in the [place] DO NOT drive yourself to the hospital.  You have stroke-like symptoms such as:  Loss of vision.  Difficulty talking.  Numbness or weakness on one side of your body.  Numbness or weakness in your arm or leg.  Not thinking clearly or feeling confused.  Your bowel movements are bloody, dark red or black in color.  You vomit or cough up blood.  You have blood in your urine.  You have shortness of breath, coughing or wheezing. MAKE SURE YOU:   Understand these instructions.  Will monitor your condition.  Seek immediate medical care if necessary. Document Released: 10/01/2008 Document Revised: 02/13/2013 Document Reviewed: 01/24/2014 [facility] Patient Information 2015 [place], [place]. This information is not intended to replace advice given to you by your health care provider. Make sure you discuss any questions you have with your health care provider.

## 2014-10-10 NOTE — ED Notes (Signed)
MD at bedside. 

## 2014-10-10 NOTE — Progress Notes (Addendum)
[facility] ED CM noted pt with coverage but no pcp listed Spoke with pt who confirms her [facility] [place]  EPIC updated  [facility] ED CM spoke with pt on how to obtain an in network specialists if needed with insurance coverage via the customer service number or web site  Cm reviewed ED level of care for crisis/emergent services and community pcp level of care to manage continuous or chronic medical concerns.  The pt voiced understanding CM encouraged pt and discussed pt's responsibility to verify with pt's insurance carrier that any recommended medical provider offered by any emergency room or a hospital provider is within the carrier's network. The pt voiced understanding

## 2014-10-10 NOTE — ED Notes (Signed)
Pt c/o chest pain that started at 0500 this morning, in center of chest. Pt states pain now radiates to left side and to shoulder blade. Pt also c/o mild SOB and nausea.

## 2014-10-10 NOTE — ED Provider Notes (Signed)
CSN: [id number]     Arrival date & time 10/10/14  0810 History   First MD Initiated Contact with Patient 10/10/14 [phone]     Chief Complaint  Patient presents with  . Shortness of Breath  . Chest Pain     (Consider location/radiation/quality/duration/timing/severity/associated sxs/prior Treatment) HPI Comments: Pt comes in with cc of chest pain. Chest pain started this morning, around 6. Pain was moderate at onset, and since then has been intermittent, L sided, radiating to the L shoulder and scapular region. No trauma. Currently, pain is barely present. There is associated dib and nausea at some point. Pt denies any chest pain leading upto today, or any exertional dyspnea. She has no hx of PE, DVT and no risk factors for the same. PT's father had MI in his los 5450s. She has HL.  The history is provided by the patient.    Past Medical History  Diagnosis Date  . Staph infection 02-11-12    started meds after positve nasal swab  . Hypercholesteremia   . Depression    Past Surgical History  Procedure Laterality Date  . Abdominal adhesion surgery      removed in may 2012  . Cesarean section    . Dilation and curettage of uterus      sab x2 8weeks  . Cesarean section  02/12/2012    Procedure: CESAREAN SECTION;  Surgeon: Dorien Chihuahuaara J. Richardson Doppole, MD;  Location: [facility];  Service: Gynecology;  Laterality: N/A;   Family History  Problem Relation Age of Onset  . Learning disabilities Sister   . Miscarriages / Stillbirths Paternal Aunt   . Congenital heart disease Cousin   . Mental retardation Cousin   . Sickle cell trait Cousin   . Other Cousin     brain abnormality   History  Substance Use Topics  . Smoking status: Never Smoker   . Smokeless tobacco: Never Used  . Alcohol Use: No   OB History    Gravida Para Term Preterm AB TAB SAB Ectopic Multiple Living   4 2 2  2  2   2      Review of Systems  Constitutional: Positive for activity change. Negative for fatigue.  Respiratory: Positive  for shortness of breath. Negative for cough and wheezing.   Cardiovascular: Positive for chest pain.  Gastrointestinal: Negative for nausea, vomiting and abdominal pain.  Genitourinary: Negative for dysuria.  Musculoskeletal: Negative for neck pain.  Neurological: Negative for dizziness, syncope and headaches.  All other systems reviewed and are negative.     Allergies  Sulfa antibiotics  Home Medications   Prior to Admission medications   Medication Sig Start Date End Date Taking? Authorizing Provider  atorvastatin (LIPITOR) 20 MG tablet Take 20 mg by mouth at bedtime.   Yes Historical Provider, MD  escitalopram (LEXAPRO) 10 MG tablet Take 10 mg by mouth at bedtime.   Yes Historical Provider, MD  influenza vac recombinant HA trivalent (FLUBLOK) injection Inject 0.5 mLs into the muscle once.   Yes Historical Provider, MD  Multiple Vitamin (MULTIVITAMIN WITH MINERALS) TABS tablet Take 1 tablet by mouth at bedtime.   Yes Historical Provider, MD   BP 102/71 mmHg  Pulse 48  Temp(Src) 98.5 F (36.9 C) (Oral)  Resp 10  SpO2 100%  LMP 09/16/2014 Physical Exam  Constitutional: She is oriented to person, place, and time. She appears well-developed and well-nourished.  HENT:  Head: Normocephalic and atraumatic.  Eyes: EOM are normal. Pupils are equal, round, and reactive  to light.  Neck: Neck supple.  Cardiovascular: Normal rate, regular rhythm, normal heart sounds and intact distal pulses.   No murmur heard. Pulmonary/Chest: Effort normal. No respiratory distress.  Abdominal: Soft. She exhibits no distension. There is no tenderness. There is no rebound and no guarding.  Neurological: She is alert and oriented to person, place, and time.  Skin: Skin is warm and dry.  Nursing note and vitals reviewed.   ED Course  Procedures (including critical care time) Labs Review Labs Reviewed  BASIC METABOLIC PANEL - Abnormal; Notable for the following:    Glucose, Bld 104 (*)    All  other components within normal limits  CBC  PRO B NATRIURETIC PEPTIDE  I-STAT TROPOININ, ED    Imaging Review Dg Chest 2 View  10/10/2014   CLINICAL DATA:  Left upper chest pain that radiates into the left shoulder and into the shoulder blades. Shortness of breath and nausea. Symptoms started this morning.  EXAM: CHEST  2 VIEW  COMPARISON:  CT 09/02/2013  FINDINGS: The heart size and mediastinal contours are within normal limits. Both lungs are clear. The visualized skeletal structures are unremarkable.  IMPRESSION: No active cardiopulmonary disease.   Electronically Signed   By: Richarda OverlieAdam  Henn M.D.   On: 10/10/2014 09:03   [place] Misc Soft Tissue  10/09/2014   CLINICAL DATA:  Right axillary mass.  EXAM: SOFT TISSUE ULTRASOUND - MISCELLANEOUS  TECHNIQUE: Real-time ultrasound performed in the right apex in the region of clinical concern.  COMPARISON:  None.  FINDINGS: Prominent fatty tissue is noted. No clearcut mass lesion noted. No abnormal fluid collection.  IMPRESSION: Prominent axillary fat is noted. No fixed mass lesion or fluid collection noted.   Electronically Signed   By: Maisie Fushomas  Register   On: 10/09/2014 13:49     EKG Interpretation   Date/Time:  Wednesday October 10 2014 08:23:07 EST Ventricular Rate:  66 PR Interval:  175 QRS Duration: 90 QT Interval:  401 QTC Calculation: 420 R Axis:   56 Text Interpretation:  Sinus rhythm No acute findings Confirmed by  Rhunette CroftNANAVATI, MD, Ramsay Bognar [phone]([id number]) on 10/10/2014 8:30:00 AM       EKG Interpretation  Date/Time:  Wednesday October 10 2014 10:17:23 EST Ventricular Rate:  46 PR Interval:  146 QRS Duration: 95 QT Interval:  459 QTC Calculation: 401 R Axis:   66 Text Interpretation:  Sinus bradycardia No acute changes besides bradycardia Confirmed by Shakea Isip, MD, Alanys Godino ([id number]) on 10/10/2014 10:35:42 AM        MDM   Final diagnoses:  Chest pain radiating to arm  Chest pain, unspecified chest pain type    Pt comes in with cc of chest  pain.  Differential diagnosis includes: ACS syndrome CHF exacerbation Valvular disorder Myocarditis Pericarditis Pericardial effusion Pneumonia Pleural effusion Pulmonary edema PE Anemia Musculoskeletal pain  PT's pain has some typical features - as it is L sided and radiating to the shoulder. Also is atypical in that the pain is intermittent, with no exertional component to it. Pain is not pleuritic or musculoskeletal in nature. EKG shows no pericarditis.  HEART score is 2 - 1 for history and 1 for risk factors.  Plan is to get trops x 2 and get her a close f/u.  10:46 AM Pt given nitro for pain (2/10) - and pt's HR dropped in the 30s and BP in the 70s SBP. Pt felt dizzy. Fluid started - and she responded well. Pt has no chest pain currently. Anterior EKG  showed sinus brady. No q waves. No ST changes Posterior EKG shows:   Date: 10/10/2014  Rate: 77  Rhythm: normal sinus rhythm  QRS Axis: normal  Intervals: normal  ST/T Wave abnormalities: normal  Conduction Disutrbances: none  Narrative Interpretation: unremarkable  Pt responded well to fluid bolus. Still chest pain free. Plan is still the same - however, i have spoken with the Cardiology card master, and requested that pt be seen as soon as possible. Trop x 2 pending.   Late entry: Pt is chest pain free. vss and wnl. Return precautions discussed. Cards f/u advised.  Derwood Kaplan, MD 10/11/14 [phone]

## 2014-10-11 ENCOUNTER — Other Ambulatory Visit: Payer: BC Managed Care – PPO

## 2014-11-09 ENCOUNTER — Institutional Professional Consult (permissible substitution): Payer: BC Managed Care – PPO | Admitting: Cardiology

## 2014-12-06 ENCOUNTER — Ambulatory Visit (INDEPENDENT_AMBULATORY_CARE_PROVIDER_SITE_OTHER): Payer: BLUE CROSS/BLUE SHIELD | Admitting: Cardiology

## 2014-12-06 ENCOUNTER — Encounter: Payer: Self-pay | Admitting: Cardiology

## 2014-12-06 VITALS — BP 100/80 | HR 84 | Ht 68.0 in | Wt 224.0 lb

## 2014-12-06 DIAGNOSIS — E785 Hyperlipidemia, unspecified: Secondary | ICD-10-CM

## 2014-12-06 DIAGNOSIS — R072 Precordial pain: Secondary | ICD-10-CM

## 2014-12-06 DIAGNOSIS — R079 Chest pain, unspecified: Secondary | ICD-10-CM | POA: Insufficient documentation

## 2014-12-06 MED ORDER — ATORVASTATIN CALCIUM 40 MG PO TABS: 40.0000 mg | ORAL_TABLET | Freq: Every day | ORAL | Status: DC

## 2014-12-06 NOTE — Patient Instructions (Addendum)
Your physician recommends that you schedule a follow-up appointment in: 6 months with Dr. Antoine PocheHochrein  We will send you a slip in about 8 weeks to recheck your lipids  We have increased you atorvastatin to 40 mg daily

## 2014-12-06 NOTE — Progress Notes (Signed)
Cardiology Office Note   Date:  12/06/2014   ID:  Margaret Kim, DOB 03/07/1976, MRN [id number]  PCP:  [place]azilandJORDAN, BETTY G, MD  Cardiologist:   Rollene RotundaJames Euphemia Lingerfelt, MD   No chief complaint on file.     History of Present Illness: Margaret Kim is a 39 y.o. female who presents for evaluation of chest pain. She is a new patient. She has no past cardiac history. However, she does have a family history with her father having myocardial infarction in his early 7650s. She also has a significant history of dyslipidemia.  I reviewed the emergency room records from 10/11/2014. She woke up at 4 AM with chest discomfort. This was in the center of her chest and radiating around left right. It made her short of breath. It was mild. Was no arm or jaw pain.  It went away spontaneously. In the emergency room there were no acute EKG changes. Labs were unremarkable. She has not had any recurrence of this. She doesn't exercise but she takes care of 2 children including 39-year-old. She walks the stairs. She pushes a vacuum. She is unable to elicit any of her symptoms with this. She denies any significant shortness of breath. She has no palpitations, presyncope or syncope. She has no PND or orthopnea. She has had no weight gain or edema.   Past Medical History  Diagnosis Date  . Staph infection 02-11-12    started meds after positve nasal swab  . Hypercholesteremia   . Depression     Past Surgical History  Procedure Laterality Date  . Abdominal adhesion surgery      removed in may 2012  . Cesarean section    . Dilation and curettage of uterus      sab x2 8weeks  . Cesarean section  02/12/2012    Procedure: CESAREAN SECTION;  Surgeon: Dorien Chihuahuaara J. Richardson Doppole, MD;  Location: [facility];  Service: Gynecology;  Laterality: N/A;     Current Outpatient Prescriptions  Medication Sig Dispense Refill  . aspirin 81 MG chewable tablet Chew 1 tablet (81 mg total) by mouth daily. 30 tablet 0  . atorvastatin (LIPITOR) 20 MG tablet  Take 20 mg by mouth at bedtime.    Marland Kitchen. escitalopram (LEXAPRO) 10 MG tablet Take 10 mg by mouth at bedtime.    . influenza vac recombinant HA trivalent (FLUBLOK) injection Inject 0.5 mLs into the muscle once.    . Multiple Vitamin (MULTIVITAMIN WITH MINERALS) TABS tablet Take 1 tablet by mouth at bedtime.    . norgestimate-ethinyl estradiol (ORTHO-CYCLEN,SPRINTEC,PREVIFEM) 0.25-35 MG-MCG tablet Take 1 tablet by mouth daily.     No current facility-administered medications for this visit.    Allergies:   Sulfa antibiotics    Social History:  The patient  reports that she has never smoked. She has never used smokeless tobacco. She reports that she does not drink alcohol or use illicit drugs.   Family History:  The patient's family history includes Congenital heart disease in her cousin; Learning disabilities in her sister; Mental retardation in her cousin; Miscarriages / [place] in her paternal aunt; Other in her cousin; Sickle cell trait in her cousin.    ROS:  Please see the history of present illness.   Otherwise, review of systems are positive for migraines.   All other systems are reviewed and negative.    PHYSICAL EXAM: VS:  BP 100/80 mmHg  Pulse 84  Ht 5\' 8"  (1.727 m)  Wt 224 lb (101.606 kg)  BMI 34.07 kg/m2 , BMI Body mass index is 34.07 kg/(m^2). GEN: Well nourished, well developed, in no acute distress HEENT: normal Neck: no JVD, carotid bruits, or masses Cardiac: RRR; no murmurs, rubs, or gallops,no edema  Respiratory:  clear to auscultation bilaterally, normal work of breathing GI: soft, nontender, nondistended, + BS MS: no deformity or atrophy Skin: warm and dry, no rash Neuro:  Strength and sensation are intact Psych: euthymic mood, full affect   EKG:  EKG is not ordered today. I reviewed the EKG from 10/11/2014.  Sinus rhythm, rate 66, axis within normal limits, intervals within normal limits, no acute ST-T wave changes.    Recent Labs: 10/10/2014: BUN 10;  Creatinine 0.79; Hemoglobin 13.6; Platelets 223; Potassium 4.5; Pro B Natriuretic peptide (BNP) 71.0; Sodium 138       Wt Readings from Last 3 Encounters:  12/06/14 224 lb (101.606 kg)  02/12/12 227 lb 6.4 oz (103.148 kg)  08/15/11 214 lb 9.6 oz (97.342 kg)      Other studies Reviewed: Additional studies/ records that were reviewed today include: ED records.  Outside office labs Review of the above records demonstrates: See elsewhere.  December 1 cholesterol 191, HDL 59, LDL 112.  Prior to the Lipitor her LDL was 226, HDL 57, total cholesterol 161   ASSESSMENT AND PLAN:  CHEST PAIN:  I think her pain is quite atypical. I think the pretest probability of obstructive coronary disease is low. I don't think further testing is indicated to rule out obstructive disease. However, she needs aggressive primary risk reduction. I did discuss with her possibly having coronary calcium score in the future.  DYSLIPIDEMIA:  Labs are reported above.  She had incredible response to initiation of Lipitor. However, with her family history the goal should be an LDL less than 100 and ideally less than 70. I have increased her Lipitor to 40 mg. She'll get a lipid profile in 8 weeks with liver enzymes.  Current medicines are reviewed at length with the patient today.  The patient does not have concerns regarding medicines.  The following changes have been made:  See above  Labs/ tests ordered today include: Lipid profile 8 weeks.  No orders of the defined types were placed in this encounter.     Disposition:   FU with me in 7 months   Signed, Rollene Rotunda, MD  12/06/2014 11:14 AM    [facility]

## 2015-12-10 ENCOUNTER — Other Ambulatory Visit: Payer: Self-pay | Admitting: Family Medicine

## 2015-12-10 DIAGNOSIS — E049 Nontoxic goiter, unspecified: Secondary | ICD-10-CM

## 2015-12-17 ENCOUNTER — Other Ambulatory Visit: Payer: Self-pay | Admitting: Family Medicine

## 2015-12-17 ENCOUNTER — Ambulatory Visit (INDEPENDENT_AMBULATORY_CARE_PROVIDER_SITE_OTHER): Payer: BLUE CROSS/BLUE SHIELD

## 2015-12-17 DIAGNOSIS — Z1231 Encounter for screening mammogram for malignant neoplasm of breast: Secondary | ICD-10-CM

## 2015-12-17 DIAGNOSIS — E049 Nontoxic goiter, unspecified: Secondary | ICD-10-CM

## 2015-12-18 ENCOUNTER — Other Ambulatory Visit: Payer: Self-pay | Admitting: Cardiology

## 2016-02-07 ENCOUNTER — Other Ambulatory Visit: Payer: Self-pay | Admitting: Cardiology

## 2016-02-07 NOTE — Telephone Encounter (Signed)
Rx request sent to pharmacy.  

## 2016-03-05 NOTE — Progress Notes (Signed)
Cardiology Office Note   Date:  03/06/2016   ID:  Margaret Kim [birth year], MRN [id number]  PCP:  Betty Swaziland, MD  Cardiologist:   Rollene Rotunda, MD   Chief Complaint  Patient presents with  . Dyslipidemia      History of Present Illness: Margaret Kim is a 40 y.o. female who presented initially for evaluation of chest pain. She has no past cardiac history. She does have a family history with her father having myocardial infarction in his early 28s. She also has a significant history of dyslipidemia.  Since I last saw her she has done well.  The patient denies any new symptoms such as chest discomfort, neck or arm discomfort. There has been no new shortness of breath, PND or orthopnea. There have been no reported palpitations, presyncope or syncope.  She is active with her kids but not exercising as much as I would like.     Past Medical History  Diagnosis Date  . Staph infection 02-11-12    started meds after positve nasal swab  . Hypercholesteremia   . Depression     Past Surgical History  Procedure Laterality Date  . Abdominal adhesion surgery      removed in may 2012  . Cesarean section    . Dilation and curettage of uterus      sab x2 8weeks  . Cesarean section  02/12/2012    Procedure: CESAREAN SECTION;  Surgeon: Dorien Chihuahua. Richardson Dopp, MD;  Location: [facility];  Service: Gynecology;  Laterality: N/A;     Current Outpatient Prescriptions  Medication Sig Dispense Refill  . atorvastatin (LIPITOR) 40 MG tablet TAKE 1 TABLET (40 MG TOTAL) BY MOUTH DAILY. 90 tablet 0  . escitalopram (LEXAPRO) 10 MG tablet Take 10 mg by mouth at bedtime.    . influenza vac recombinant HA trivalent (FLUBLOK) injection Inject 0.5 mLs into the muscle once.    . Multiple Vitamin (MULTIVITAMIN WITH MINERALS) TABS tablet Take 1 tablet by mouth at bedtime.     No current facility-administered medications for this visit.    Allergies:   Sulfa antibiotics    ROS:  Please see the history of  present illness.   Otherwise, review of systems are positive for migraines.   All other systems are reviewed and negative.    PHYSICAL EXAM: VS:  BP 120/84 mmHg  Pulse 66  Ht  (1.753 m)  Wt 236 lb (107.049 kg)  BMI 34.84 kg/m2 , BMI Body mass index is 34.84 kg/(m^2). GENERAL:  Well appearing NECK:  No jugular venous distention, waveform within normal limits, carotid upstroke brisk and symmetric, no bruits, no thyromegaly LYMPHATICS:  No cervical, inguinal adenopathy LUNGS:  Clear to auscultation bilaterally CHEST:  Unremarkable HEART:  PMI not displaced or sustained,S1 and S2 within normal limits, no S3, no S4, no clicks, no rubs, no murmurs ABD:  Flat, positive bowel sounds normal in frequency in pitch, no bruits, no rebound, no guarding, no midline pulsatile mass, no hepatomegaly, no splenomegaly EXT:  2 plus pulses throughout, no edema, no cyanosis no clubbing   EKG:  EKG is ordered today. Sinus rhythm, rate 66, axis within normal limits, intervals within normal limits, no acute ST-T wave changes.    Recent Labs: No results found for requested labs within last 365 days.       Wt Readings from Last 3 Encounters:  03/06/16 236 lb (107.049 kg)  12/06/14 224 lb (101.606 kg)  02/12/12 227 lb 6.4  oz (103.148 kg)      Other studies Reviewed: Additional studies/ records that were reviewed today include: ED records.  None Review of the above records demonstrates: See elsewhere.  December 1 of last year cholesterol 191, HDL 59, LDL 112.  Prior to the Lipitor her LDL was 226, HDL 57, total cholesterol 161304   ASSESSMENT AND PLAN:  CHEST PAIN:  She has had no further symptoms.  No further testing is indicated.    DYSLIPIDEMIA:  The goal will be an LDL of mid 70s.  She will agree to switch to Crestor if she does not reach this target with Lipitor 40 mg.    Current medicines are reviewed at length with the patient today.  The patient does not have concerns regarding  medicines.  The following changes have been made:  See above  Labs/ tests ordered today include:  No orders of the defined types were placed in this encounter.     Disposition:   FU with me in 12  months   Signed, Rollene RotundaJames Bernedette Auston, MD  03/06/2016 5:00 PM    [facility]

## 2016-03-06 ENCOUNTER — Encounter: Payer: Self-pay | Admitting: Cardiology

## 2016-03-06 ENCOUNTER — Ambulatory Visit (INDEPENDENT_AMBULATORY_CARE_PROVIDER_SITE_OTHER): Payer: BLUE CROSS/BLUE SHIELD | Admitting: Cardiology

## 2016-03-06 VITALS — BP 120/84 | HR 66 | Ht 69.0 in | Wt 236.0 lb

## 2016-03-06 DIAGNOSIS — Z79899 Other long term (current) drug therapy: Secondary | ICD-10-CM

## 2016-03-06 DIAGNOSIS — E785 Hyperlipidemia, unspecified: Secondary | ICD-10-CM | POA: Diagnosis not present

## 2016-03-06 NOTE — Patient Instructions (Signed)
Your physician wants you to follow-up in: 1 year. You will receive a reminder letter in the mail two months in advance. If you don't receive a letter, please call our office to schedule the follow-up appointment.  Your physician recommends that you return for lab work in 6 Months Fasting Lipids Liver

## 2016-04-09 ENCOUNTER — Ambulatory Visit
Admission: RE | Admit: 2016-04-09 | Discharge: 2016-04-09 | Disposition: A | Discharge status: Home / Self Care | Payer: BLUE CROSS/BLUE SHIELD | Source: Ambulatory Visit | Attending: Family Medicine | Admitting: Family Medicine

## 2016-04-09 DIAGNOSIS — Z1231 Encounter for screening mammogram for malignant neoplasm of breast: Secondary | ICD-10-CM

## 2016-04-14 ENCOUNTER — Other Ambulatory Visit: Payer: Self-pay | Admitting: Family Medicine

## 2016-04-14 DIAGNOSIS — R928 Other abnormal and inconclusive findings on diagnostic imaging of breast: Secondary | ICD-10-CM

## 2016-04-16 ENCOUNTER — Other Ambulatory Visit: Payer: Self-pay | Admitting: Physician Assistant

## 2016-04-16 DIAGNOSIS — R928 Other abnormal and inconclusive findings on diagnostic imaging of breast: Secondary | ICD-10-CM

## 2016-04-22 ENCOUNTER — Ambulatory Visit
Admission: RE | Admit: 2016-04-22 | Discharge: 2016-04-22 | Disposition: A | Discharge status: Home / Self Care | Payer: BLUE CROSS/BLUE SHIELD | Source: Ambulatory Visit | Attending: Family Medicine | Admitting: Family Medicine

## 2016-04-22 DIAGNOSIS — R928 Other abnormal and inconclusive findings on diagnostic imaging of breast: Secondary | ICD-10-CM

## 2016-05-27 ENCOUNTER — Telehealth: Payer: Self-pay | Admitting: Cardiology

## 2016-05-27 MED ORDER — ATORVASTATIN CALCIUM 40 MG PO TABS: 40.0000 mg | ORAL_TABLET | Freq: Every day | ORAL | 2 refills | Status: DC

## 2016-05-27 NOTE — Telephone Encounter (Signed)
*  STAT* If patient is at the pharmacy, call can be transferred to refill team.   1. Which medications need to be refilled? (please list name of each medication and dose if known) atorvastatin  2. Which pharmacy/location (including street and city if local pharmacy) is medication to be sent to? [facility]  3. Do they need a 30 day or 90 day supply? 90

## 2016-05-27 NOTE — Telephone Encounter (Signed)
rx refill sent to pharmacy electronically.

## 2017-02-01 ENCOUNTER — Other Ambulatory Visit: Payer: Self-pay | Admitting: Cardiology

## 2017-02-01 NOTE — Telephone Encounter (Signed)
Rx request sent to pharmacy.  

## 2017-03-26 ENCOUNTER — Other Ambulatory Visit: Payer: Self-pay | Admitting: Obstetrics and Gynecology

## 2017-03-26 DIAGNOSIS — Z1231 Encounter for screening mammogram for malignant neoplasm of breast: Secondary | ICD-10-CM

## 2017-05-26 ENCOUNTER — Ambulatory Visit
Admission: RE | Admit: 2017-05-26 | Discharge: 2017-05-26 | Disposition: A | Discharge status: Home / Self Care | Payer: BLUE CROSS/BLUE SHIELD | Source: Ambulatory Visit | Attending: Obstetrics and Gynecology | Admitting: Obstetrics and Gynecology

## 2017-05-26 DIAGNOSIS — Z1231 Encounter for screening mammogram for malignant neoplasm of breast: Secondary | ICD-10-CM

## 2017-05-27 ENCOUNTER — Other Ambulatory Visit: Payer: Self-pay | Admitting: Obstetrics and Gynecology

## 2017-05-27 DIAGNOSIS — R928 Other abnormal and inconclusive findings on diagnostic imaging of breast: Secondary | ICD-10-CM

## 2017-06-04 ENCOUNTER — Ambulatory Visit
Admission: RE | Admit: 2017-06-04 | Discharge: 2017-06-04 | Disposition: A | Discharge status: Home / Self Care | Payer: BLUE CROSS/BLUE SHIELD | Source: Ambulatory Visit | Attending: Obstetrics and Gynecology | Admitting: Obstetrics and Gynecology

## 2017-06-04 ENCOUNTER — Ambulatory Visit: Admission: RE | Admit: 2017-06-04 | Payer: BLUE CROSS/BLUE SHIELD | Source: Ambulatory Visit

## 2017-06-04 DIAGNOSIS — R928 Other abnormal and inconclusive findings on diagnostic imaging of breast: Secondary | ICD-10-CM

## 2017-08-01 ENCOUNTER — Other Ambulatory Visit: Payer: Self-pay | Admitting: Cardiology

## 2018-02-12 ENCOUNTER — Other Ambulatory Visit: Payer: Self-pay | Admitting: Cardiology

## 2018-03-16 ENCOUNTER — Other Ambulatory Visit: Payer: Self-pay | Admitting: Cardiology

## 2018-03-16 MED ORDER — ATORVASTATIN CALCIUM 40 MG PO TABS: 40.0000 mg | ORAL_TABLET | Freq: Every day | ORAL | 0 refills | Status: DC

## 2018-03-16 NOTE — Telephone Encounter (Signed)
New message    *STAT* If patient is at the pharmacy, call can be transferred to refill team.   1. Which medications need to be refilled? (please list name of each medication and dose if known) atorvastatin (LIPITOR) 40 MG tablet  2. Which pharmacy/location (including street and city if local pharmacy) is medication to be sent to?[id number] [place], [place] - [address]  3. Do they need a 30 day or 90 day supply? 30

## 2018-04-12 ENCOUNTER — Other Ambulatory Visit: Payer: Self-pay | Admitting: Cardiology

## 2018-04-26 NOTE — Progress Notes (Signed)
Cardiology Office Note   Date:  04/27/2018   ID:  Margaret Kim, Margaret Kim [birth year], MRN [id number]  PCP:  Heide Scales, PA-C  Cardiologist:   Rollene Rotunda, MD   Chief Complaint  Patient presents with  . Dyslipidemia      History of Present Illness: Margaret Kim is a 42 y.o. female who presented initially for evaluation of chest pain. I saw her for this in 2017.   She is now comes back for follow up of her dyslipidemia.  The patient does have a little more dyspnea with activity than she used to have.  She will notice this with activity like climbing stairs.  She denies however any cardiovascular symptoms such as chest pressure, neck or arm discomfort.  She is been diagnosed with sleep apnea and is now using CPAP.    Past Medical History:  Diagnosis Date  . Depression   . Hypercholesteremia   . Staph infection 02-11-12   started meds after positve nasal swab    Past Surgical History:  Procedure Laterality Date  . ABDOMINAL ADHESION SURGERY     removed in may 2012  . CESAREAN SECTION    . CESAREAN SECTION  02/12/2012   Procedure: CESAREAN SECTION;  Surgeon: Dorien Chihuahua. Richardson Dopp, MD;  Location: [facility];  Service: Gynecology;  Laterality: N/A;  . DILATION AND CURETTAGE OF UTERUS     sab x2 8weeks     Current Outpatient Medications  Medication Sig Dispense Refill  . escitalopram (LEXAPRO) 10 MG tablet Take 10 mg by mouth at bedtime.    . rosuvastatin (CRESTOR) 20 MG tablet Take 1 tablet (20 mg total) by mouth daily. 90 tablet 3   No current facility-administered medications for this visit.     Allergies:   Sulfa antibiotics    ROS:  Please see the history of present illness.   Otherwise, review of systems are positive for none.   All other systems are reviewed and negative.    PHYSICAL EXAM: VS:  BP 118/82   Pulse 72   Ht 5\' 9"  (1.753 m)   Wt 253 lb (114.8 kg)   BMI 37.36 kg/m  , BMI Body mass index is 37.36 kg/m.  GENERAL:  Well appearing NECK:  No jugular  venous distention, waveform within normal limits, carotid upstroke brisk and symmetric, no bruits, no thyromegaly LUNGS:  Clear to auscultation bilaterally CHEST:  Unremarkable HEART:  PMI not displaced or sustained,S1 and S2 within normal limits, no S3, no S4, no clicks, no rubs, no murmurs ABD:  Flat, positive bowel sounds normal in frequency in pitch, no bruits, no rebound, no guarding, no midline pulsatile mass, no hepatomegaly, no splenomegaly EXT:  2 plus pulses throughout, no edema, no cyanosis no clubbing    EKG:  EKG is  ordered today. Sinus rhythm, rate 72 , axis within normal limits, intervals within normal limits, no acute ST-T wave changes.    Recent Labs: No results found for requested labs within last 8760 hours.       Wt Readings from Last 3 Encounters:  04/27/18 253 lb (114.8 kg)  03/06/16 236 lb (107 kg)  12/06/14 224 lb (101.6 kg)      Other studies Reviewed: Additional studies/ records that were reviewed today include: Labs Review of the above records demonstrates:    ASSESSMENT AND PLAN:  CHEST PAIN:    She is no longer having this.  She does have some mild dyspnea but I do not have  a strong suspicion for obstructive CAD.  We will continue with primary risk reduction.    DYSLIPIDEMIA: We have a long discussion about pluses and minuses of statin therapy in the situation and she very much prefers to be aggressive with risk reduction.  This is given her family history.  The goal LDL should be in the 70s then.  Hers was 108.  She will be switched to Crestor 20 mg daily and I will repeat lipid and liver profiles in 8 weeks.   Current medicines are reviewed at length with the patient today.  The patient does not have concerns regarding medicines.  The following changes have been made:  As above.   Labs/ tests ordered today include:    Orders Placed This Encounter  Procedures  . Lipid panel  . Hepatic function panel  . EKG 12-Lead     Disposition:    FU with me in 24 months   Signed, Rollene RotundaJames Melvine Julin, MD  04/27/2018 4:10 PM     [facility]

## 2018-04-27 ENCOUNTER — Encounter: Payer: Self-pay | Admitting: Cardiology

## 2018-04-27 ENCOUNTER — Ambulatory Visit (INDEPENDENT_AMBULATORY_CARE_PROVIDER_SITE_OTHER): Payer: BLUE CROSS/BLUE SHIELD | Admitting: Cardiology

## 2018-04-27 VITALS — BP 118/82 | HR 72 | Ht 69.0 in | Wt 253.0 lb

## 2018-04-27 DIAGNOSIS — R0609 Other forms of dyspnea: Secondary | ICD-10-CM

## 2018-04-27 DIAGNOSIS — E785 Hyperlipidemia, unspecified: Secondary | ICD-10-CM

## 2018-04-27 DIAGNOSIS — R0600 Dyspnea, unspecified: Secondary | ICD-10-CM

## 2018-04-27 DIAGNOSIS — Z79899 Other long term (current) drug therapy: Secondary | ICD-10-CM

## 2018-04-27 MED ORDER — ROSUVASTATIN CALCIUM 20 MG PO TABS: 20.0000 mg | ORAL_TABLET | Freq: Every day | ORAL | 3 refills | Status: DC

## 2018-04-27 NOTE — Patient Instructions (Signed)
Medication Instructions: Dr Antoine PocheHochrein has recommended making the following medication changes: 1. STOP Atorvastatin 2. START Rosuvastatin (Crestor) 20 mg - take 1 tablet daily  Labwork: Your physician recommends that you return for lab work in 10 weeks - FASTING.  Testing/Procedures: NONE ORDERED  Follow-up: Dr Antoine PocheHochrein recommends that you schedule a follow-up appointment in 2 years. You will receive a reminder letter in the mail two months in advance. If you don't receive a letter, please call our office to schedule the follow-up appointment.  If you need a refill on your cardiac medications before your next appointment, please call your pharmacy.

## 2018-05-14 ENCOUNTER — Other Ambulatory Visit: Payer: Self-pay | Admitting: Cardiology

## 2018-07-05 ENCOUNTER — Other Ambulatory Visit: Payer: Self-pay | Admitting: Obstetrics and Gynecology

## 2018-07-05 DIAGNOSIS — Z1231 Encounter for screening mammogram for malignant neoplasm of breast: Secondary | ICD-10-CM

## 2018-07-12 ENCOUNTER — Ambulatory Visit
Admission: RE | Admit: 2018-07-12 | Discharge: 2018-07-12 | Disposition: A | Discharge status: Home / Self Care | Payer: BLUE CROSS/BLUE SHIELD | Source: Ambulatory Visit | Attending: Obstetrics and Gynecology | Admitting: Obstetrics and Gynecology

## 2018-07-12 DIAGNOSIS — Z1231 Encounter for screening mammogram for malignant neoplasm of breast: Secondary | ICD-10-CM

## 2019-06-23 ENCOUNTER — Other Ambulatory Visit: Payer: Self-pay

## 2019-06-23 MED ORDER — ROSUVASTATIN CALCIUM 20 MG PO TABS: 20.0000 mg | ORAL_TABLET | Freq: Every day | ORAL | 3 refills | Status: DC

## 2019-10-06 ENCOUNTER — Other Ambulatory Visit: Payer: Self-pay | Admitting: Physician Assistant

## 2019-10-06 DIAGNOSIS — Z1231 Encounter for screening mammogram for malignant neoplasm of breast: Secondary | ICD-10-CM

## 2019-11-28 ENCOUNTER — Ambulatory Visit
Admission: RE | Admit: 2019-11-28 | Discharge: 2019-11-28 | Disposition: A | Discharge status: Home / Self Care | Payer: BLUE CROSS/BLUE SHIELD | Source: Ambulatory Visit | Attending: Physician Assistant | Admitting: Physician Assistant

## 2019-11-28 ENCOUNTER — Other Ambulatory Visit: Payer: Self-pay

## 2019-11-28 DIAGNOSIS — Z1231 Encounter for screening mammogram for malignant neoplasm of breast: Secondary | ICD-10-CM

## 2020-01-26 ENCOUNTER — Ambulatory Visit: Payer: Self-pay | Attending: Internal Medicine

## 2020-01-26 DIAGNOSIS — Z23 Encounter for immunization: Secondary | ICD-10-CM

## 2020-01-26 NOTE — Progress Notes (Signed)
   Covid-19 Vaccination Clinic  Name:  Xylina DEYA BIGOS    MRN: [id number] DOB: 1976-02-01  01/26/2020  Ms. Garrette was observed post Covid-19 immunization for 15 minutes without incident. She was provided with Vaccine Information Sheet and instruction to access the V-Safe system.   Ms. Sachse was instructed to call 911 with any severe reactions post vaccine: Marland Kitchen Difficulty breathing  . Swelling of face and throat  . A fast heartbeat  . A bad rash all over body  . Dizziness and weakness   Immunizations Administered    Name Date Dose VIS Date Route   Moderna COVID-19 Vaccine 01/26/2020 11:08 AM 0.5 mL 10/03/2019 Intramuscular   Manufacturer: Moderna   Lot: [id number]   NDC: 64158-309-40

## 2020-02-27 ENCOUNTER — Ambulatory Visit: Payer: Self-pay | Attending: Internal Medicine

## 2020-02-27 DIAGNOSIS — Z23 Encounter for immunization: Secondary | ICD-10-CM

## 2020-02-27 NOTE — Progress Notes (Signed)
   Covid-19 Vaccination Clinic  Name:  Margaret Kim    MRN: [id number] DOB: [birth year]  02/27/2020  [name] was observed post Covid-19 immunization for 15 minutes without incident. She was provided with Vaccine Information Sheet and instruction to access the V-Safe system.   [name] was instructed to call 911 with any severe reactions post vaccine: Marland Kitchen Difficulty breathing  . Swelling of face and throat  . A fast heartbeat  . A bad rash all over body  . Dizziness and weakness   Immunizations Administered    Name Date Dose VIS Date Route   Moderna COVID-19 Vaccine 02/27/2020 11:58 AM 0.5 mL 10/2019 Intramuscular   Manufacturer: Moderna   Lot: [id number]   NDC: 19924-155-16

## 2020-03-28 DIAGNOSIS — Z7189 Other specified counseling: Secondary | ICD-10-CM | POA: Insufficient documentation

## 2020-03-28 NOTE — Progress Notes (Signed)
Cardiology Office Note   Date:  03/29/2020   ID:  Margaret Kim, Margaret Kim [birth year], MRN [id number]  PCP:  [place], [place]  Cardiologist:   Minus Breeding, MD   Chief Complaint  Patient presents with  . Hyperlipidemia      History of Present Illness: Margaret Kim is a 44 y.o. female who presented initially for evaluation of chest pain. I saw her for this in 2017.   She is now comes back for follow up of her dyslipidemia.    Since I last saw her she has been diagnosed with diabetes.  Her hemoglobin A1c started out at 10.1.  Currently 8.2.  I do see her cholesterol with a total of 200, triglycerides 169, LDL 119 and HDL 60.  She is now working with [facility] on weight loss and has changed her diet completely and has lost 6 pounds in the first 2 weeks.  She is starting to do some walking with her son. The patient denies any new symptoms such as chest discomfort, neck or arm discomfort. There has been no new shortness of breath, PND or orthopnea. There have been no reported palpitations, presyncope or syncope.   Past Medical History:  Diagnosis Date  . Depression   . Hypercholesteremia   . Staph infection 02-11-12   started meds after positve nasal swab    Past Surgical History:  Procedure Laterality Date  . ABDOMINAL ADHESION SURGERY     removed in may 2012  . CESAREAN SECTION    . CESAREAN SECTION  02/12/2012   Procedure: CESAREAN SECTION;  Surgeon: Maeola Sarah. Landry Mellow, MD;  Location: [facility];  Service: Gynecology;  Laterality: N/A;  . DILATION AND CURETTAGE OF UTERUS     sab x2 8weeks     Current Outpatient Medications  Medication Sig Dispense Refill  . escitalopram (LEXAPRO) 10 MG tablet Take by mouth.    . metFORMIN (GLUCOPHAGE-XR) 500 MG 24 hr tablet Take 500 mg by mouth daily.    . rosuvastatin (CRESTOR) 20 MG tablet Take 1 tablet (20 mg total) by mouth daily. 90 tablet 3   No current facility-administered medications for this visit.     Allergies:   Sulfa antibiotics    ROS:  Please see the history of present illness.   Otherwise, review of systems are positive for none.   All other systems are reviewed and negative.    PHYSICAL EXAM: VS:  BP 120/80   Pulse 71   Temp (!) 97.1 F (36.2 C)   Ht 5\' 9"  (1.753 m)   Wt 255 lb (115.7 kg)   SpO2 96%   BMI 37.66 kg/m  , BMI Body mass index is 37.66 kg/m.  GENERAL:  Well appearing NECK:  No jugular venous distention, waveform within normal limits, carotid upstroke brisk and symmetric, no bruits, no thyromegaly LUNGS:  Clear to auscultation bilaterally CHEST:  Unremarkable HEART:  PMI not displaced or sustained,S1 and S2 within normal limits, no S3, no S4, no clicks, no rubs, no murmurs ABD:  Flat, positive bowel sounds normal in frequency in pitch, no bruits, no rebound, no guarding, no midline pulsatile mass, no hepatomegaly, no splenomegaly EXT:  2 plus pulses throughout, no edema, no cyanosis no clubbing    EKG:  EKG is  ordered today. Sinus rhythm, rate sinus rhythm, rate 71, axis within normal limits, intervals within normal limits, no acute ST-T wave changes., axis within normal limits, intervals within normal limits, no acute  ST-T wave changes.    Recent Labs: No results found for requested labs within last 8760 hours.       Wt Readings from Last 3 Encounters:  03/29/20 255 lb (115.7 kg)  04/27/18 253 lb (114.8 kg)  03/06/16 236 lb (107 kg)      Other studies Reviewed: Additional studies/ records that were reviewed today include: Labs Review of the above records demonstrates: See elsewhere   ASSESSMENT AND PLAN:  CHEST PAIN:    She is not complaining of this.  She does have some mild dyspnea on exertion.  I will address her significant risk factors as below.  DM: We talked at length about this.  This not changes the calculations around her risk.  I would like to be able to calculate a MESA score which includes coronary calcium so we can judge  goals of therapy.  I spent quite a bit of time discussing with her different scenarios and possibilities without her risk based on whether or not she was diabetic and various calcium levels.  For now she can remain on the current statin.  We will get a coronary calcium score.  DYSLIPIDEMIA: Goals of therapy will be based on the above.    Current medicines are reviewed at length with the patient today.  The patient does not have concerns regarding medicines.  The following changes have been made: None  Labs/ tests ordered today include:  NA  Orders Placed This Encounter  Procedures  . CT CARDIAC SCORING  . EKG 12-Lead     Disposition:   FU with me in 24  months   Signed, Rollene Rotunda, MD  03/29/2020 1:22 PM    [facility]

## 2020-03-29 ENCOUNTER — Encounter: Payer: Self-pay | Admitting: Cardiology

## 2020-03-29 ENCOUNTER — Other Ambulatory Visit: Payer: Self-pay

## 2020-03-29 ENCOUNTER — Ambulatory Visit (INDEPENDENT_AMBULATORY_CARE_PROVIDER_SITE_OTHER): Payer: BC Managed Care – PPO | Admitting: Cardiology

## 2020-03-29 VITALS — BP 120/80 | HR 71 | Temp 97.1°F | Ht 69.0 in | Wt 255.0 lb

## 2020-03-29 DIAGNOSIS — Z7189 Other specified counseling: Secondary | ICD-10-CM | POA: Diagnosis not present

## 2020-03-29 DIAGNOSIS — E785 Hyperlipidemia, unspecified: Secondary | ICD-10-CM | POA: Diagnosis not present

## 2020-03-29 DIAGNOSIS — R072 Precordial pain: Secondary | ICD-10-CM

## 2020-03-29 NOTE — Patient Instructions (Addendum)
Medication Instructions:  NO CHANGES *If you need a refill on your cardiac medications before your next appointment, please call your pharmacy*  Lab Work: NONE ORDERED THIS VISIT  Testing/Procedures: CT CALCIUM SCORE  Follow-Up: At [facility], you and your health needs are our priority.  As part of our continuing mission to provide you with exceptional heart care, we have created designated Provider Care Teams.  These Care Teams include your primary Cardiologist (physician) and Advanced Practice Providers (APPs -  Physician Assistants and Nurse Practitioners) who all work together to provide you with the care you need, when you need it.  Your next appointment:   12 month(s)   You will receive a reminder letter in the mail two months in advance. If you don't receive a letter, please call our office to schedule the follow-up appointment.  The format for your next appointment:   In Person  Provider:   Minus Breeding, MD   Other Instructions: Dr. Percival Spanish has ordered a CT coronary calcium score. This test is done at [address]. This is $150 out of pocket. Coronary CalciumScan A coronary calcium scan is an imaging test used to look for deposits of calcium and other fatty materials (plaques) in the inner lining of the blood vessels of the heart (coronary arteries). These deposits of calcium and plaques can partly clog and narrow the coronary arteries without producing any symptoms or warning signs. This puts a person at risk for a heart attack. This test can detect these deposits before symptoms develop. Tell a health care provider about:  Any allergies you have.  All medicines you are taking, including vitamins, herbs, eye drops, creams, and over-the-counter medicines.  Any problems you or family members have had with anesthetic medicines.  Any blood disorders you have.  Any surgeries you have had.  Any medical conditions you have.  Whether you are pregnant  or may be pregnant. What are the risks? Generally, this is a safe procedure. However, problems may occur, including:  Harm to a pregnant woman and her unborn baby. This test involves the use of radiation. Radiation exposure can be dangerous to a pregnant woman and her unborn baby. If you are pregnant, you generally should not have this procedure done.  Slight increase in the risk of cancer. This is because of the radiation involved in the test. What happens before the procedure? No preparation is needed for this procedure. What happens during the procedure?  You will undress and remove any jewelry around your neck or chest.  You will put on a hospital gown.  Sticky electrodes will be placed on your chest. The electrodes will be connected to an electrocardiogram (ECG) machine to record a tracing of the electrical activity of your heart.  A CT scanner will take pictures of your heart. During this time, you will be asked to lie still and hold your breath for 2-3 seconds while a picture of your heart is being taken. The procedure may vary among health care providers and hospitals. What happens after the procedure?  You can get dressed.  You can return to your normal activities.  It is up to you to get the results of your test. Ask your health care provider, or the department that is doing the test, when your results will be ready. Summary  A coronary calcium scan is an imaging test used to look for deposits of calcium and other fatty materials (plaques) in the inner lining of the blood  vessels of the heart (coronary arteries).  Generally, this is a safe procedure. Tell your health care provider if you are pregnant or may be pregnant.  No preparation is needed for this procedure.  A CT scanner will take pictures of your heart.  You can return to your normal activities after the scan is done. This information is not intended to replace advice given to you by your health care provider.  Make sure you discuss any questions you have with your health care provider. Document Released: 04/16/2008 Document Revised: 09/07/2016 Document Reviewed: 09/07/2016 Elsevier Interactive Patient Education  2017 [employer].

## 2020-04-03 ENCOUNTER — Other Ambulatory Visit: Payer: Self-pay

## 2020-04-03 ENCOUNTER — Ambulatory Visit (INDEPENDENT_AMBULATORY_CARE_PROVIDER_SITE_OTHER)
Admission: RE | Admit: 2020-04-03 | Discharge: 2020-04-03 | Disposition: A | Discharge status: Home / Self Care | Payer: Self-pay | Source: Ambulatory Visit | Attending: Cardiology | Admitting: Cardiology

## 2020-04-03 DIAGNOSIS — R072 Precordial pain: Secondary | ICD-10-CM

## 2020-06-03 ENCOUNTER — Other Ambulatory Visit: Payer: Self-pay

## 2020-06-03 MED ORDER — ROSUVASTATIN CALCIUM 20 MG PO TABS: 20.0000 mg | ORAL_TABLET | Freq: Every day | ORAL | 3 refills | Status: DC

## 2020-11-22 ENCOUNTER — Other Ambulatory Visit: Payer: Self-pay | Admitting: Anesthesiology

## 2020-11-22 DIAGNOSIS — Z1231 Encounter for screening mammogram for malignant neoplasm of breast: Secondary | ICD-10-CM

## 2021-01-06 ENCOUNTER — Inpatient Hospital Stay: Admission: RE | Admit: 2021-01-06 | Payer: BC Managed Care – PPO | Source: Ambulatory Visit

## 2021-02-26 ENCOUNTER — Inpatient Hospital Stay: Admission: RE | Admit: 2021-02-26 | Payer: BC Managed Care – PPO | Source: Ambulatory Visit

## 2021-06-04 ENCOUNTER — Other Ambulatory Visit: Payer: Self-pay | Admitting: Cardiology

## 2021-06-26 ENCOUNTER — Other Ambulatory Visit: Payer: Self-pay | Admitting: Cardiology

## 2021-07-16 ENCOUNTER — Other Ambulatory Visit: Payer: Self-pay

## 2021-07-16 MED ORDER — ROSUVASTATIN CALCIUM 20 MG PO TABS: 20.0000 mg | ORAL_TABLET | Freq: Every day | ORAL | 0 refills | Status: DC

## 2021-10-03 ENCOUNTER — Ambulatory Visit: Payer: BC Managed Care – PPO | Admitting: Cardiology

## 2021-10-13 ENCOUNTER — Other Ambulatory Visit: Payer: Self-pay | Admitting: Cardiology

## 2021-11-09 DIAGNOSIS — E118 Type 2 diabetes mellitus with unspecified complications: Secondary | ICD-10-CM | POA: Insufficient documentation

## 2021-11-09 NOTE — Progress Notes (Signed)
Cardiology Office Note   Date:  11/10/2021   ID:  Margaret Ninel, Kim 16-Jan-1976, MRN [id number]  PCP:  Gwenlyn Found, MD  Cardiologist:   Rollene Rotunda, MD   Chief Complaint  Patient presents with   Chest Pain    Past few months.      History of Present Illness: Margaret Kim is a 46 y.o. female who presented initially for evaluation of chest pain. I saw her for this in 2017.   She is now comes back for follow up of her dyslipidemia.   She also has been diagnosed with diabetes. In 2021 she had a coronary calcium score of zero.     She presents for follow-up chest discomfort.  She has been getting some right upper chest discomfort.  It radiates straight through to her right shoulder blade.  Seems to happen when she is lying down at night.  Is an aching mild to moderate dull discomfort.  She has to wait for it to go away spontaneously.  She might change positions.  Does not seem to happen during the day.  She has been doing some walking for exercise and a little bit on the elliptical.  She has not been able to reproduce this.  She thinks that discomfort is unlike anything she has had before.  Is been happening for couple months seems to be a somewhat stable pattern.  She has had no associated nausea vomiting or diaphoresis.  She has had no new shortness of breath, PND or orthopnea.   Past Medical History:  Diagnosis Date   Depression    Hypercholesteremia    Staph infection 02-11-12   started meds after positve nasal swab    Past Surgical History:  Procedure Laterality Date   ABDOMINAL ADHESION SURGERY     removed in may 2012   CESAREAN SECTION     CESAREAN SECTION  02/12/2012   Procedure: CESAREAN SECTION;  Surgeon: Dorien Chihuahua. Richardson Dopp, MD;  Location: [facility];  Service: Gynecology;  Laterality: N/A;   DILATION AND CURETTAGE OF UTERUS     sab x2 8weeks     Current Outpatient Medications  Medication Sig Dispense Refill   escitalopram (LEXAPRO) 10 MG tablet Take by mouth.      metFORMIN (GLUCOPHAGE) 1000 MG tablet Take 1,000 mg by mouth 2 (two) times daily with a meal.     rosuvastatin (CRESTOR) 40 MG tablet Take 1 tablet (40 mg total) by mouth daily. 90 tablet 3   No current facility-administered medications for this visit.    Allergies:   Sulfa antibiotics    ROS:  Please see the history of present illness.   Otherwise, review of systems are positive for none.   All other systems are reviewed and negative.    PHYSICAL EXAM: VS:  BP 112/84 (BP Location: Left Arm, Patient Position: Sitting, Cuff Size: Large)    Pulse 69    Ht 5\' 9"  (1.753 m)    Wt 259 lb (117.5 kg)    BMI 38.25 kg/m  , BMI Body mass index is 38.25 kg/m.  GENERAL:  Well appearing NECK:  No jugular venous distention, waveform within normal limits, carotid upstroke brisk and symmetric, no bruits, no thyromegaly LUNGS:  Clear to auscultation bilaterally CHEST:  Unremarkable HEART:  PMI not displaced or sustained,S1 and S2 within normal limits, no S3, no S4, no clicks, no rubs, no murmurs ABD:  Flat, positive bowel sounds normal in frequency in pitch, no bruits, no  rebound, no guarding, no midline pulsatile mass, no hepatomegaly, no splenomegaly EXT:  2 plus pulses throughout, no edema, no cyanosis no clubbing   EKG:  EKG is  ordered today. Sinus rhythm, rate sinus rhythm, rate 69, axis within normal limits, intervals within normal limits, no acute ST-T wave changes., axis within normal limits, intervals within normal limits, no acute ST-T wave changes.    Recent Labs: No results found for requested labs within last 8760 hours.       Wt Readings from Last 3 Encounters:  11/10/21 259 lb (117.5 kg)  03/29/20 255 lb (115.7 kg)  04/27/18 253 lb (114.8 kg)      Other studies Reviewed: Additional studies/ records that were reviewed today include: Labs Review of the above records demonstrates: See elsewhere   ASSESSMENT AND PLAN:  CHEST PAIN:    Chest pain has nonanginal greater  than anginal qualities.  Given her risk factors diabetes I would like to bring her back for testing.  I will bring the patient back for a POET (Plain Old Exercise Test). This will allow me to screen for obstructive coronary disease, risk stratify and very importantly provide a prescription for exercise.  DM:   A1c was 8.9.  She is having this followed by Gwenlyn Found, MD  DYSLIPIDEMIA: Her mesa score is really only 2.1 but I think this underestimates her risk since this does not include the direct LDL of 161.  Given her poorly controlled diabetes I think the goal of therapy should be an LDL less than 100 and I will increase her Crestor to 40 mg daily.  She will have a lipid profile by her primary provider.   WEIGHT: She is working with [employer] management program at [facility].  We talked about Ozempic.  She did not tolerate this even at the lower doses.  She might want to try it again however and will discuss this with Gwenlyn Found, MD  The following changes have been made: None  Labs/ tests ordered today include:  None  Orders Placed This Encounter  Procedures   EXERCISE TOLERANCE TEST (ETT)   EKG 12-Lead     Disposition:   FU with me as needed.   Signed, Rollene Rotunda, MD  11/10/2021 10:37 AM    [facility]

## 2021-11-10 ENCOUNTER — Ambulatory Visit: Payer: 59 | Admitting: Cardiology

## 2021-11-10 ENCOUNTER — Other Ambulatory Visit: Payer: Self-pay

## 2021-11-10 ENCOUNTER — Encounter: Payer: Self-pay | Admitting: Cardiology

## 2021-11-10 VITALS — BP 112/84 | HR 69 | Ht 69.0 in | Wt 259.0 lb

## 2021-11-10 DIAGNOSIS — E118 Type 2 diabetes mellitus with unspecified complications: Secondary | ICD-10-CM | POA: Diagnosis not present

## 2021-11-10 DIAGNOSIS — R072 Precordial pain: Secondary | ICD-10-CM

## 2021-11-10 DIAGNOSIS — E785 Hyperlipidemia, unspecified: Secondary | ICD-10-CM | POA: Diagnosis not present

## 2021-11-10 MED ORDER — ROSUVASTATIN CALCIUM 40 MG PO TABS: 40.0000 mg | ORAL_TABLET | Freq: Every day | ORAL | 3 refills | Status: DC

## 2021-11-10 NOTE — Patient Instructions (Addendum)
Medication Instructions:   -Increase rosuvastatin (crestor) to 40mg  once daily.  *If you need a refill on your cardiac medications before your next appointment, please call your pharmacy*   Testing/Procedures: Your physician has requested that you have an exercise tolerance test. For further information please visit . Please also follow instruction sheet, as given. This will be done at [facility].   Follow-Up: At [facility], you and your health needs are our priority.  As part of our continuing mission to provide you with exceptional heart care, we have created designated Provider Care Teams.  These Care Teams include your primary Cardiologist (physician) and Advanced Practice Providers (APPs -  Physician Assistants and Nurse Practitioners) who all work together to provide you with the care you need, when you need it.  We recommend signing up for the patient portal called "MyChart".  Sign up information is provided on this After Visit Summary.  MyChart is used to connect with patients for Virtual Visits (Telemedicine).  Patients are able to view lab/test results, encounter notes, upcoming appointments, etc.  Non-urgent messages can be sent to your provider as well.   To learn more about what you can do with MyChart, go to [facility].    Your next appointment:   We will see you on an as needed basis.  Provider:   [email or web address], MD   Cardiopulmonary Exercise Stress Test Cardiopulmonary exercise testing (CPET) is a test that checks how the heart and lungs react to exercise. This is called "exercise capacity." During this test, you will walk or run on a treadmill or pedal on a stationary bike. As you walk or run, tests will be done on your heart and lungs. You may have this test to: Find out why you are short of breath. Check for exercise intolerance. See how your lungs work. See how your heart works. Check whether your heart or lungs are  responding to treatments. Check if you have a heart or lung problem. Check if you are healthy enough to have surgery. Tell your doctor about: Any allergies you have. All medicines you are taking. Any problems you or family members have had with anesthetic medicines. Any blood disorders you have. Any surgeries you have had. Any medical conditions you have. Whether you are pregnant or may be pregnant. What are the risks? Generally, this is a safe test. However, problems may occur, including: Chest pain. Shortness of breath. Leg pain. Irregular heartbeat. What happens before the procedure? Follow instructions from your doctor about what you cannot eat or drink. Ask your doctor about changing or stopping your normal medicines. Wear loose, comfortable clothing and shoes. If you use an inhaler, bring it with you to the test. What happens during the procedure?  A blood pressure cuff will be placed on your arm. Stick-on patches (electrodes) will be placed on your chest. They will be attached to an EKG machine. A clip-on monitor that shows the amount of oxygen in your blood will be placed on your finger (pulse oximeter). A clip will be placed on your nose and a mouthpiece will be placed in your mouth. This may be held in place with a headpiece. You will breathe through the mouthpiece during the test. You will be asked to start exercising. You will be closely watched while you exercise. The amount of effort for your exercise will be slowly increased. During exercise, the test will measure: Your heart rate. Your heart rhythm. Your oxygen blood level. The amount of oxygen and  carbon dioxide that you breathe out. The test will end when: You have finished the test. You have reached your maximum ability to exercise. You have chest or leg pain, dizziness, or shortness of breath. The test may vary among doctors and hospitals. What can I expect after the test? Your blood pressure, heart rate,  breathing rate, and blood oxygen level will be monitored until you leave the hospital or clinic. Summary Cardiopulmonary exercise testing (CPET) is a test that checks how your heart and lungs react to exercise. Follow your doctor's instructions about food and drink, and what medicines to change or stop. During this test, you will walk or run on a treadmill or pedal on a stationary bike. Tests will be done as you run or walk. This information is not intended to replace advice given to you by your health care provider. Make sure you discuss any questions you have with your health care provider. Document Revised: 01/05/2019 Document Reviewed: 06/23/2018 Elsevier Patient Education  2022 [employer].

## 2021-11-14 ENCOUNTER — Telehealth (HOSPITAL_COMMUNITY): Payer: Self-pay | Admitting: *Deleted

## 2021-11-14 NOTE — Telephone Encounter (Signed)
Close encounter 

## 2021-11-18 ENCOUNTER — Ambulatory Visit (HOSPITAL_COMMUNITY): Admission: RE | Admit: 2021-11-18 | Payer: 59 | Source: Ambulatory Visit | Attending: Cardiology | Admitting: Cardiology

## 2021-11-21 ENCOUNTER — Telehealth (HOSPITAL_COMMUNITY): Payer: Self-pay | Admitting: *Deleted

## 2021-11-21 NOTE — Telephone Encounter (Signed)
Close encounter 

## 2021-11-25 ENCOUNTER — Ambulatory Visit (HOSPITAL_COMMUNITY): Admission: RE | Admit: 2021-11-25 | Payer: 59 | Source: Ambulatory Visit | Attending: Cardiology | Admitting: Cardiology

## 2021-11-28 ENCOUNTER — Telehealth (HOSPITAL_COMMUNITY): Payer: Self-pay | Admitting: *Deleted

## 2021-11-28 NOTE — Telephone Encounter (Signed)
Close encounter 

## 2021-12-03 ENCOUNTER — Ambulatory Visit (HOSPITAL_COMMUNITY): Admission: RE | Admit: 2021-12-03 | Payer: 59 | Source: Ambulatory Visit | Attending: Cardiology | Admitting: Cardiology

## 2021-12-05 ENCOUNTER — Telehealth (HOSPITAL_COMMUNITY): Payer: Self-pay | Admitting: *Deleted

## 2021-12-05 NOTE — Telephone Encounter (Signed)
Close encounter 

## 2021-12-09 ENCOUNTER — Other Ambulatory Visit: Payer: Self-pay

## 2021-12-09 ENCOUNTER — Ambulatory Visit (HOSPITAL_COMMUNITY)
Admission: RE | Admit: 2021-12-09 | Discharge: 2021-12-09 | Disposition: A | Discharge status: Home / Self Care | Payer: 59 | Source: Ambulatory Visit | Attending: Cardiovascular Disease | Admitting: Cardiovascular Disease

## 2021-12-09 DIAGNOSIS — R072 Precordial pain: Secondary | ICD-10-CM | POA: Diagnosis not present

## 2021-12-09 LAB — EXERCISE TOLERANCE TEST
Angina Index: 0
Base ST Depression (mm): 0 mm
Duke Treadmill Score: 7
Estimated workload: 8.3
Exercise duration (min): 6 min
Exercise duration (sec): 53 s
MPHR: 175 {beats}/min
Peak HR: 157 {beats}/min
Percent HR: 89 %
Rest HR: 71 {beats}/min
ST Depression (mm): 0 mm

## 2022-01-13 ENCOUNTER — Other Ambulatory Visit: Payer: Self-pay | Admitting: Cardiology

## 2022-01-16 IMAGING — MG DIGITAL SCREENING BILAT W/ TOMO W/ CAD
6 of 12 series · 6 of 36 positions shown · non-contrast
Comparison: Previous exam(s).

CLINICAL DATA: Screening.

EXAM:
DIGITAL SCREENING BILATERAL MAMMOGRAM WITH TOMO AND CAD

[R MLO synth-2D (1 of 2)]
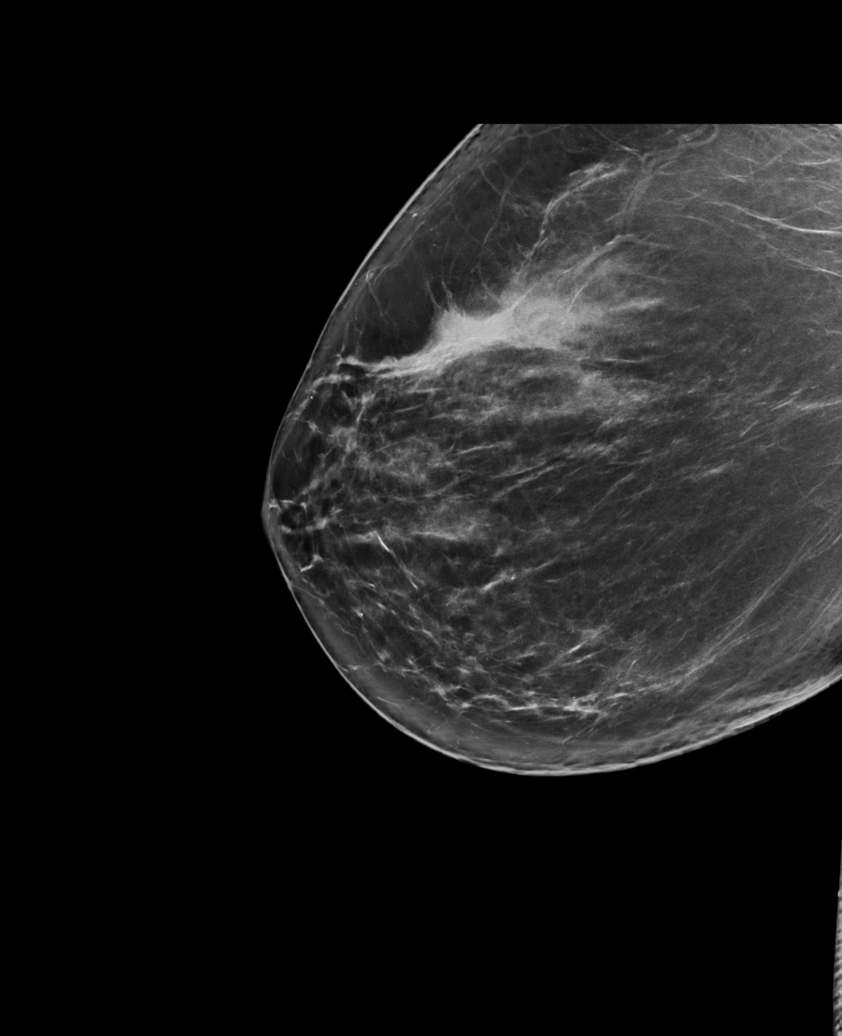

[L CC synth-2D]
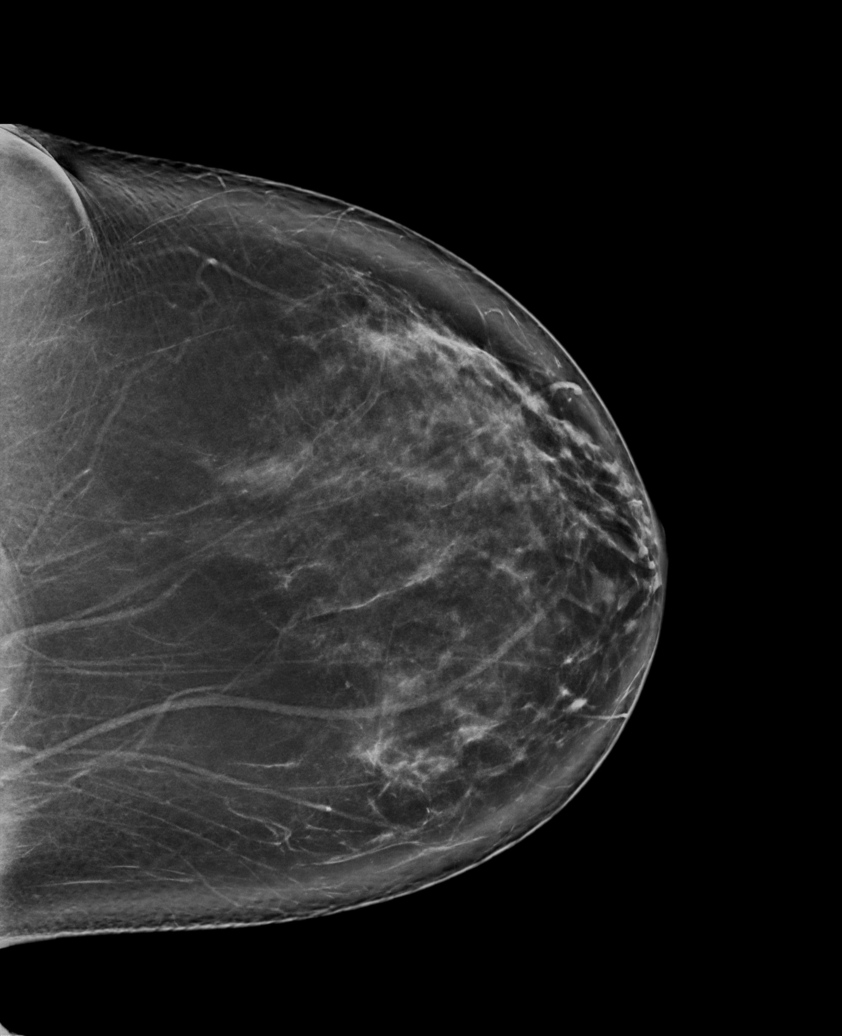

[R MLO synth-2D (2 of 2)]
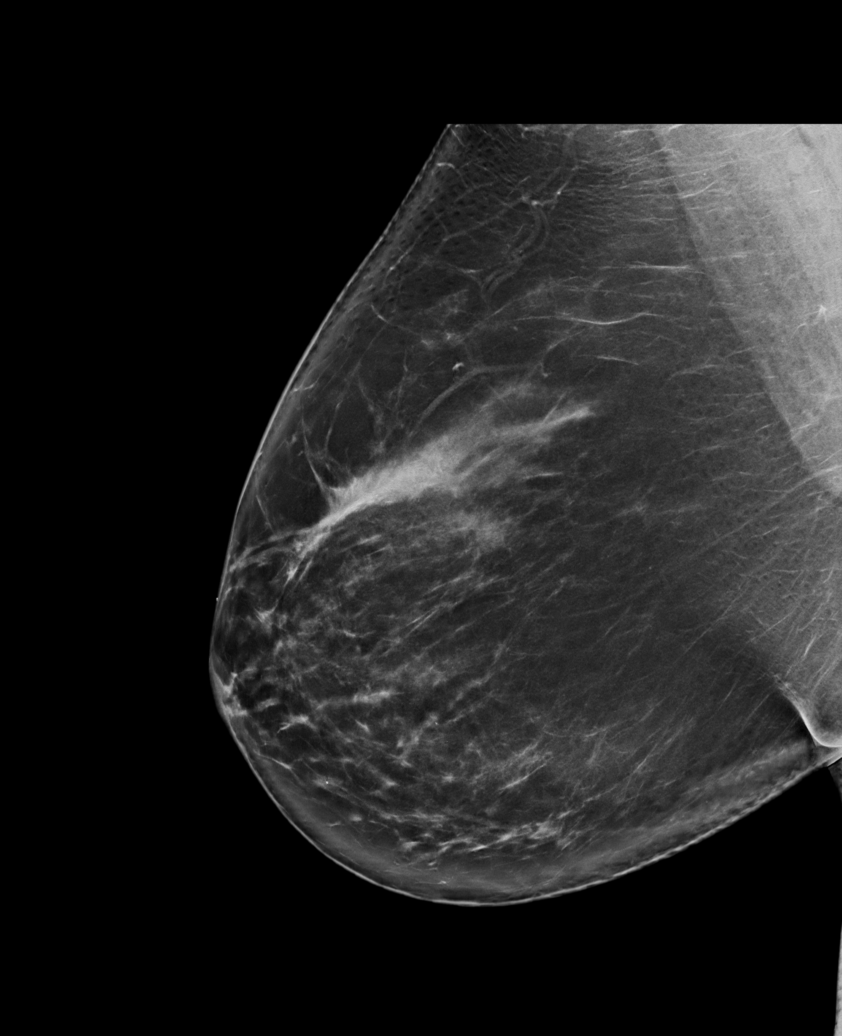

[L MLO synth-2D (1 of 2)]
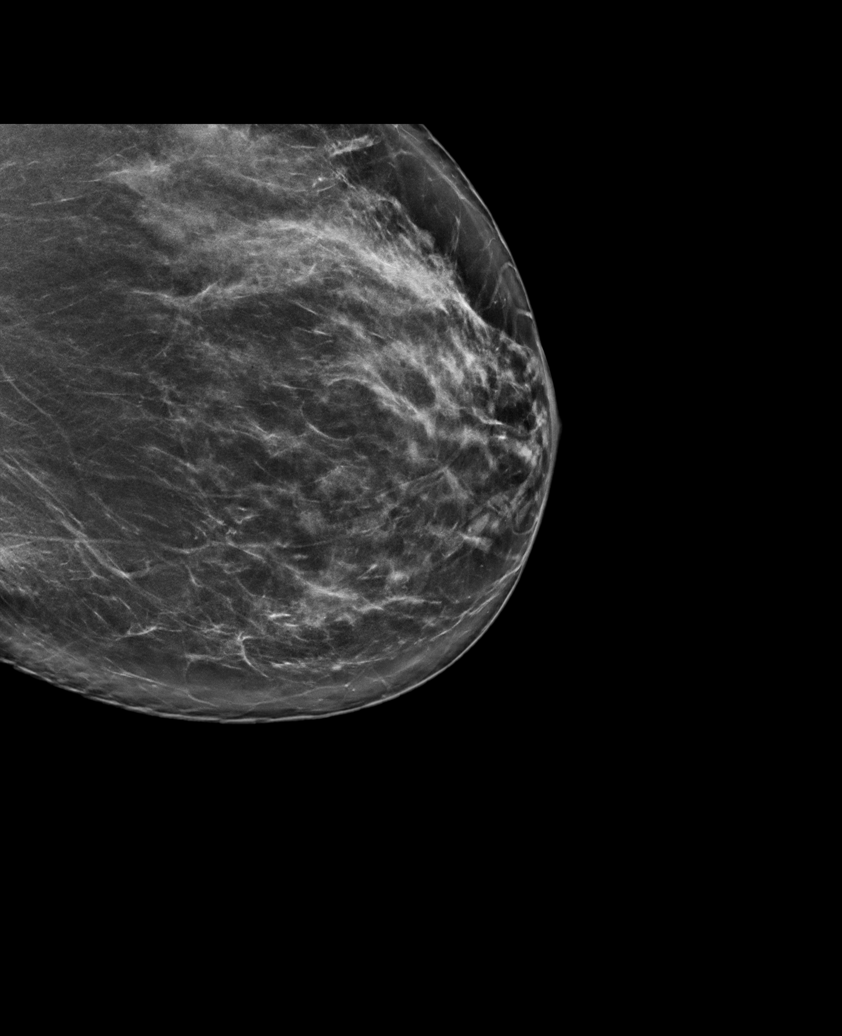

[R CC synth-2D]
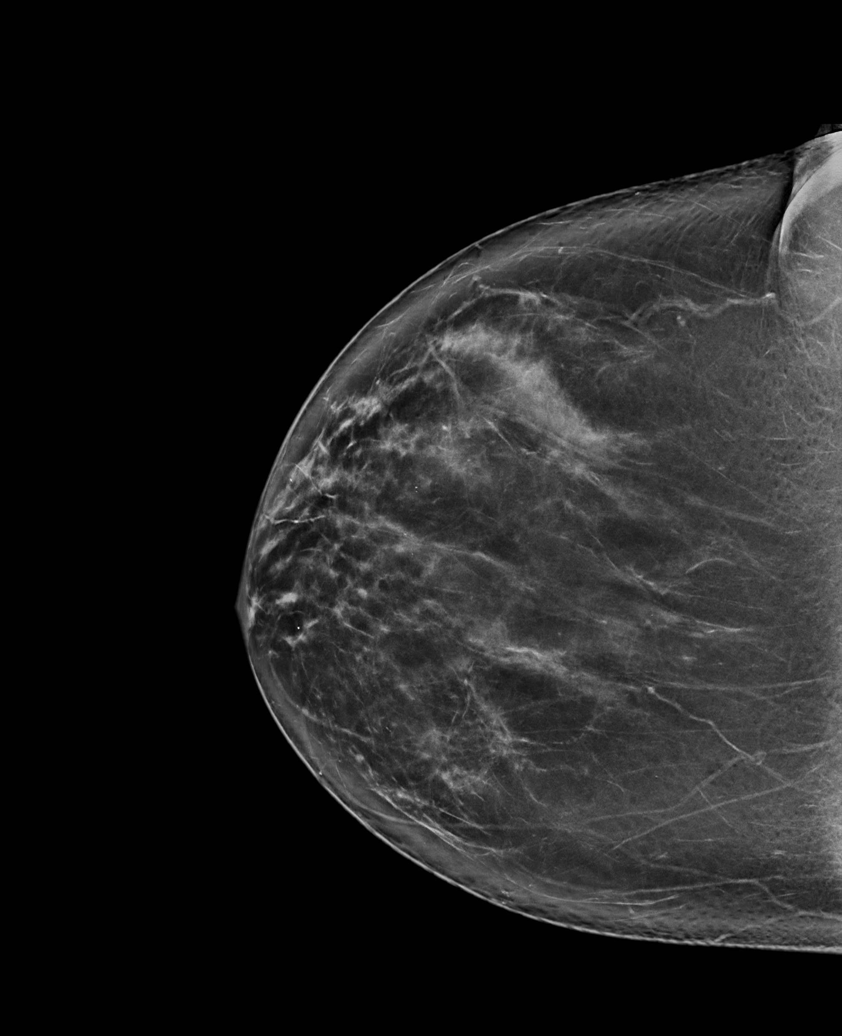

[L MLO synth-2D (2 of 2)]
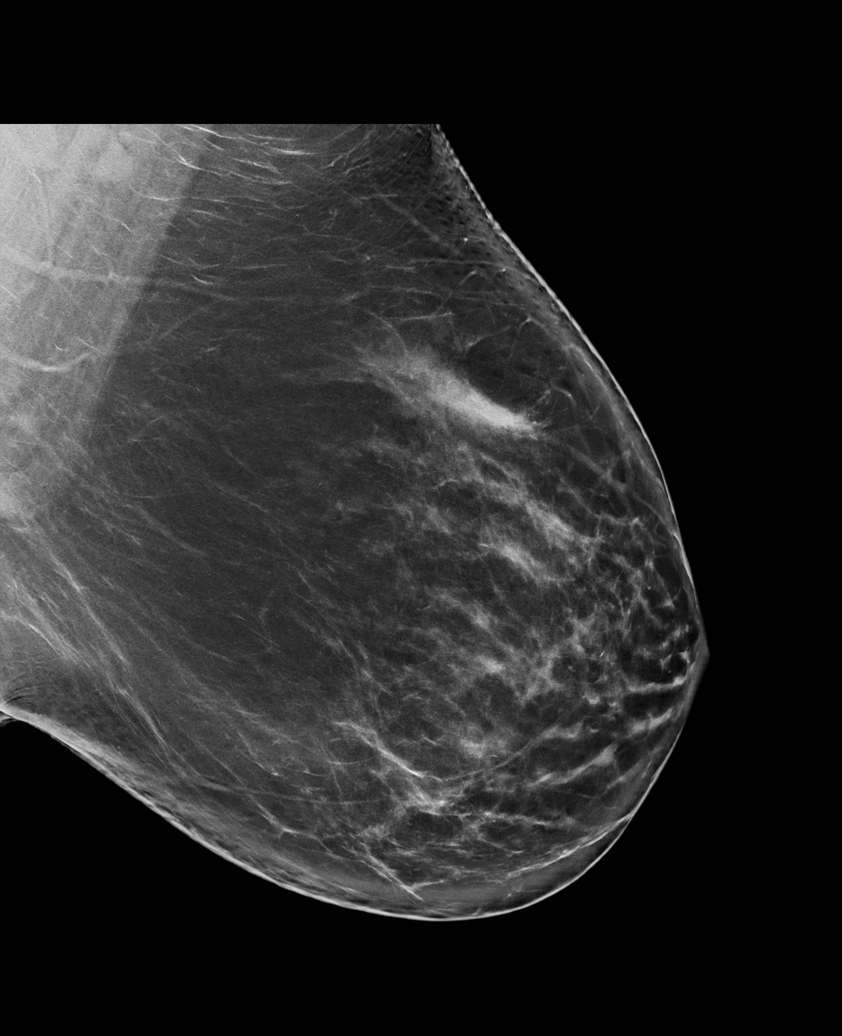

[6 of 36 positions shown; findings below may reference images not displayed]

ACR Breast Density Category c: The breast tissue is heterogeneously
dense, which may obscure small masses.
FINDINGS: There are no findings suspicious for malignancy. Images were
processed with CAD.
IMPRESSION: No mammographic evidence of malignancy. A result letter of this
screening mammogram will be mailed directly to the patient.

RECOMMENDATION:
Screening mammogram in one year. (Code:FT-U-LHB)

BI-RADS CATEGORY  1: Negative.

## 2022-03-25 ENCOUNTER — Other Ambulatory Visit: Payer: Self-pay | Admitting: Radiology

## 2022-11-20 ENCOUNTER — Other Ambulatory Visit: Payer: Self-pay | Admitting: Cardiology

## 2023-11-25 ENCOUNTER — Other Ambulatory Visit: Payer: Self-pay | Admitting: Cardiology

## 2023-12-21 ENCOUNTER — Other Ambulatory Visit: Payer: Self-pay | Admitting: Cardiology

## 2024-01-07 ENCOUNTER — Other Ambulatory Visit: Payer: Self-pay | Admitting: Cardiology
